# Patient Record
Sex: Female | Born: 1935 | Hispanic: No | Smoking: Never smoker
Health system: Southern US, Community
[De-identification: ages and names within clinical notes are randomized; demographics above are authoritative.]

## PROBLEM LIST (undated history)

## (undated) DIAGNOSIS — E7800 Pure hypercholesterolemia, unspecified: Secondary | ICD-10-CM

## (undated) DIAGNOSIS — K219 Gastro-esophageal reflux disease without esophagitis: Secondary | ICD-10-CM

## (undated) DIAGNOSIS — J45909 Unspecified asthma, uncomplicated: Secondary | ICD-10-CM

## (undated) DIAGNOSIS — Z8619 Personal history of other infectious and parasitic diseases: Secondary | ICD-10-CM

## (undated) HISTORY — DX: Unspecified asthma, uncomplicated: J45.909

## (undated) HISTORY — DX: Personal history of other infectious and parasitic diseases: Z86.19

---

## 2011-03-10 DIAGNOSIS — H269 Unspecified cataract: Secondary | ICD-10-CM | POA: Diagnosis not present

## 2011-03-10 DIAGNOSIS — H2510 Age-related nuclear cataract, unspecified eye: Secondary | ICD-10-CM | POA: Diagnosis not present

## 2011-04-07 DIAGNOSIS — J309 Allergic rhinitis, unspecified: Secondary | ICD-10-CM | POA: Diagnosis not present

## 2011-04-07 DIAGNOSIS — J0100 Acute maxillary sinusitis, unspecified: Secondary | ICD-10-CM | POA: Diagnosis not present

## 2011-06-09 DIAGNOSIS — Z79899 Other long term (current) drug therapy: Secondary | ICD-10-CM | POA: Diagnosis not present

## 2011-06-09 DIAGNOSIS — I10 Essential (primary) hypertension: Secondary | ICD-10-CM | POA: Diagnosis not present

## 2011-06-09 DIAGNOSIS — E559 Vitamin D deficiency, unspecified: Secondary | ICD-10-CM | POA: Diagnosis not present

## 2011-06-09 DIAGNOSIS — E7800 Pure hypercholesterolemia, unspecified: Secondary | ICD-10-CM | POA: Diagnosis not present

## 2011-06-23 DIAGNOSIS — Z1231 Encounter for screening mammogram for malignant neoplasm of breast: Secondary | ICD-10-CM | POA: Diagnosis not present

## 2011-08-04 DIAGNOSIS — J209 Acute bronchitis, unspecified: Secondary | ICD-10-CM | POA: Diagnosis not present

## 2011-09-18 DIAGNOSIS — G500 Trigeminal neuralgia: Secondary | ICD-10-CM | POA: Diagnosis not present

## 2011-10-23 DIAGNOSIS — G500 Trigeminal neuralgia: Secondary | ICD-10-CM | POA: Diagnosis not present

## 2011-12-29 DIAGNOSIS — Z23 Encounter for immunization: Secondary | ICD-10-CM | POA: Diagnosis not present

## 2012-04-21 DIAGNOSIS — L820 Inflamed seborrheic keratosis: Secondary | ICD-10-CM | POA: Diagnosis not present

## 2012-04-21 DIAGNOSIS — D485 Neoplasm of uncertain behavior of skin: Secondary | ICD-10-CM | POA: Diagnosis not present

## 2012-06-15 DIAGNOSIS — M949 Disorder of cartilage, unspecified: Secondary | ICD-10-CM | POA: Diagnosis not present

## 2012-06-15 DIAGNOSIS — K219 Gastro-esophageal reflux disease without esophagitis: Secondary | ICD-10-CM | POA: Diagnosis not present

## 2012-06-15 DIAGNOSIS — I10 Essential (primary) hypertension: Secondary | ICD-10-CM | POA: Diagnosis not present

## 2012-06-15 DIAGNOSIS — M899 Disorder of bone, unspecified: Secondary | ICD-10-CM | POA: Diagnosis not present

## 2012-06-15 DIAGNOSIS — E7800 Pure hypercholesterolemia, unspecified: Secondary | ICD-10-CM | POA: Diagnosis not present

## 2012-06-15 DIAGNOSIS — Z79899 Other long term (current) drug therapy: Secondary | ICD-10-CM | POA: Diagnosis not present

## 2012-06-28 DIAGNOSIS — Z1231 Encounter for screening mammogram for malignant neoplasm of breast: Secondary | ICD-10-CM | POA: Diagnosis not present

## 2012-07-16 DIAGNOSIS — R509 Fever, unspecified: Secondary | ICD-10-CM | POA: Diagnosis not present

## 2012-08-02 DIAGNOSIS — J0100 Acute maxillary sinusitis, unspecified: Secondary | ICD-10-CM | POA: Diagnosis not present

## 2012-12-01 DIAGNOSIS — Z23 Encounter for immunization: Secondary | ICD-10-CM | POA: Diagnosis not present

## 2013-06-20 DIAGNOSIS — K219 Gastro-esophageal reflux disease without esophagitis: Secondary | ICD-10-CM | POA: Diagnosis not present

## 2013-06-20 DIAGNOSIS — M899 Disorder of bone, unspecified: Secondary | ICD-10-CM | POA: Diagnosis not present

## 2013-06-20 DIAGNOSIS — Z0000 Encounter for general adult medical examination without abnormal findings: Secondary | ICD-10-CM | POA: Diagnosis not present

## 2013-06-20 DIAGNOSIS — E7800 Pure hypercholesterolemia, unspecified: Secondary | ICD-10-CM | POA: Diagnosis not present

## 2013-06-20 DIAGNOSIS — Z780 Asymptomatic menopausal state: Secondary | ICD-10-CM | POA: Diagnosis not present

## 2013-06-20 DIAGNOSIS — M949 Disorder of cartilage, unspecified: Secondary | ICD-10-CM | POA: Diagnosis not present

## 2013-06-20 DIAGNOSIS — I10 Essential (primary) hypertension: Secondary | ICD-10-CM | POA: Diagnosis not present

## 2013-06-20 DIAGNOSIS — E559 Vitamin D deficiency, unspecified: Secondary | ICD-10-CM | POA: Diagnosis not present

## 2013-06-20 DIAGNOSIS — Z23 Encounter for immunization: Secondary | ICD-10-CM | POA: Diagnosis not present

## 2013-06-20 DIAGNOSIS — Z79899 Other long term (current) drug therapy: Secondary | ICD-10-CM | POA: Diagnosis not present

## 2013-06-29 DIAGNOSIS — Z1231 Encounter for screening mammogram for malignant neoplasm of breast: Secondary | ICD-10-CM | POA: Diagnosis not present

## 2013-10-03 DIAGNOSIS — H26499 Other secondary cataract, unspecified eye: Secondary | ICD-10-CM | POA: Diagnosis not present

## 2013-11-29 DIAGNOSIS — Z23 Encounter for immunization: Secondary | ICD-10-CM | POA: Diagnosis not present

## 2014-06-22 DIAGNOSIS — Z0000 Encounter for general adult medical examination without abnormal findings: Secondary | ICD-10-CM | POA: Diagnosis not present

## 2014-06-22 DIAGNOSIS — Z79899 Other long term (current) drug therapy: Secondary | ICD-10-CM | POA: Diagnosis not present

## 2014-06-22 DIAGNOSIS — G500 Trigeminal neuralgia: Secondary | ICD-10-CM | POA: Diagnosis not present

## 2014-06-22 DIAGNOSIS — I10 Essential (primary) hypertension: Secondary | ICD-10-CM | POA: Diagnosis not present

## 2014-06-22 DIAGNOSIS — E780 Pure hypercholesterolemia: Secondary | ICD-10-CM | POA: Diagnosis not present

## 2014-06-22 DIAGNOSIS — K219 Gastro-esophageal reflux disease without esophagitis: Secondary | ICD-10-CM | POA: Diagnosis not present

## 2014-07-03 DIAGNOSIS — Z1231 Encounter for screening mammogram for malignant neoplasm of breast: Secondary | ICD-10-CM | POA: Diagnosis not present

## 2014-12-06 DIAGNOSIS — Z23 Encounter for immunization: Secondary | ICD-10-CM | POA: Diagnosis not present

## 2015-02-28 ENCOUNTER — Encounter: Payer: Self-pay | Admitting: Sports Medicine

## 2015-02-28 ENCOUNTER — Ambulatory Visit (INDEPENDENT_AMBULATORY_CARE_PROVIDER_SITE_OTHER): Payer: Medicare Other | Admitting: Sports Medicine

## 2015-02-28 ENCOUNTER — Ambulatory Visit (INDEPENDENT_AMBULATORY_CARE_PROVIDER_SITE_OTHER): Payer: Medicare Other

## 2015-02-28 DIAGNOSIS — M79671 Pain in right foot: Secondary | ICD-10-CM

## 2015-02-28 DIAGNOSIS — M722 Plantar fascial fibromatosis: Secondary | ICD-10-CM | POA: Diagnosis not present

## 2015-02-28 MED ORDER — TRIAMCINOLONE ACETONIDE 10 MG/ML IJ SUSP: 10.0000 mg | Freq: Once | INTRAMUSCULAR | Status: DC

## 2015-02-28 NOTE — Progress Notes (Signed)
Patient ID: Wanda Patrick, female   DOB: [birth year], 80 y.o.   MRN: [id number] Subjective: Wanda Patrick is a 80 y.o. female patient presents to office with complaint of heel pain on the right. Patient admits to post static dyskinesia for 2 months in duration after walking a lot at a [place] show. Patient has treated this problem with icing, gentle stretching, and where her inserts. Patient reports that this treatment has helped her get through the holidays but it is still bothersome. Admits to most recently lateral foot/ankle pain. Denies know injury/sprain/trauma. Patient denies any other pedal complaints at this time.  Review of Systems  All other systems reviewed and are negative.  There are no active problems to display for this patient.  No current outpatient prescriptions on file prior to visit.   No current facility-administered medications on file prior to visit.   Allergies  Allergen Reactions  . Erythromycin   . Macrodantin [Nitrofurantoin]   . Sulfa Antibiotics     Objective: Physical Exam General: The patient is alert and oriented x3 in no acute distress.  Dermatology: Skin is warm, dry and supple bilateral lower extremities. Nails 1-10 are within normal . There is no erythema, edema, no eccymosis, no open lesions present. Integument is otherwise unremarkable.  Vascular: Dorsalis Pedis pulse and Posterior Tibial pulse are 1/4 bilateral. + varicosities bilateral. Capillary fill time is immediate to all digits.  Neurological: Grossly intact to light touch with an achilles reflex of +2/5 and a negative Tinel's sign bilateral.  Musculoskeletal: Tenderness to palpation at the medial calcaneal tubercale and through the insertion of the plantar fascia on the right foot. No pain with palpation to lateral foot or ankle on right. No pain with compression of calcaneus bilateral. No pain with tuning fork to calcaneus bilateral. No pain with calf compression bilateral.  All joints  range of motion within normal limits bilateral. Strength 5/5 in all groups bilateral.    Xray, Righ foot:  Normal osseous mineralization. Joint spaces preserved. No fracture/dislocation/boney destruction. Bunion and Lesser hammertoe with most contracture at 2nd toe, Calcaneal spur present with mild thickening of plantar fascia. No other soft tissue abnormalities or radiopaque foreign bodies.   Assessment and Plan: Problem List Items Addressed This Visit    None    Visit Diagnoses    Right foot pain    -  Primary    Relevant Medications    triamcinolone acetonide (KENALOG) 10 MG/ML injection 10 mg    Other Relevant Orders    DG Foot 2 Views Right    Plantar fasciitis of right foot        Relevant Medications    triamcinolone acetonide (KENALOG) 10 MG/ML injection 10 mg       -Complete examination performed. Discussed with patient in detail the condition of plantar fasciitis, how this occurs and general treatment options. Explained both conservative and surgical treatments. Explained that ocassional lateral foot and ankle discomfort is likely from compensation.   -After oral consent and aseptic prep, injected a mixture containing 1 ml of 2%  plain lidocaine, 1 ml 0.5% plain marcaine, 0.5 ml of kenalog 10 and 0.5 ml of dexamethasone phosphate into right heel. Post-injection care discussed with patient.  -No anti-inflammatories or oral meds given at this time due to previous history of gastritis  -Recommended good supportive shoes and advised use of custom insert of which she owns; added felt kinetic wedge to heel post to add additional support and stability  -Explained  in detail the use of the fascial brace which was dispensed at today's visit. -Explained and dispensed to patient daily stretching exercises. -Recommend patient to ice affected area 1-2x daily. -Patient to return to office in 3 weeks for follow up or sooner if problems or questions arise.  Landis Martins, DPM

## 2015-02-28 NOTE — Patient Instructions (Signed)

## 2015-02-28 NOTE — Progress Notes (Deleted)
   Subjective:    Patient ID: Wanda Patrick, female    DOB: 07/01/1935, 80 y.o.   MRN: [id number]  HPI    Review of Systems  All other systems reviewed and are negative.      Objective:   Physical Exam        Assessment & Plan:  d

## 2015-03-21 ENCOUNTER — Encounter: Payer: Self-pay | Admitting: Sports Medicine

## 2015-03-21 ENCOUNTER — Ambulatory Visit (INDEPENDENT_AMBULATORY_CARE_PROVIDER_SITE_OTHER): Payer: Medicare Other | Admitting: Sports Medicine

## 2015-03-21 DIAGNOSIS — M6588 Other synovitis and tenosynovitis, other site: Secondary | ICD-10-CM | POA: Diagnosis not present

## 2015-03-21 DIAGNOSIS — M79671 Pain in right foot: Secondary | ICD-10-CM

## 2015-03-21 DIAGNOSIS — M722 Plantar fascial fibromatosis: Secondary | ICD-10-CM

## 2015-03-21 DIAGNOSIS — M7750 Other enthesopathy of unspecified foot: Secondary | ICD-10-CM

## 2015-03-21 MED ORDER — TRIAMCINOLONE ACETONIDE 10 MG/ML IJ SUSP: 10.0000 mg | Freq: Once | INTRAMUSCULAR | Status: DC

## 2015-03-21 NOTE — Progress Notes (Signed)
Patient ID: MADSION WORDLAW, female   DOB: 10-Oct-1935, 80 y.o.   MRN: [id number]  Subjective: Wanda Patrick is a 80 y.o. female patient presents to office for follow up eval of heel pain on the right, s/p injection #1 states that the injection has helped the bottom of the heel but now is having more pain to ankle that is made worse by brace. Patient denies any other pedal complaints at this time.   There are no active problems to display for this patient.  Current Outpatient Prescriptions on File Prior to Visit  Medication Sig Dispense Refill  . gabapentin (NEURONTIN) 300 MG capsule Take 300 mg by mouth daily.    Marland Kitchen losartan (COZAAR) 50 MG tablet Take 50 mg by mouth daily.    . meclizine (ANTIVERT) 25 MG tablet Take 25 mg by mouth 3 (three) times daily as needed for dizziness.    Marland Kitchen omeprazole (PRILOSEC) 20 MG capsule Take 20 mg by mouth daily.    . simvastatin (ZOCOR) 20 MG tablet Take 20 mg by mouth daily.     Current Facility-Administered Medications on File Prior to Visit  Medication Dose Route Frequency Provider Last Rate Last Dose  . triamcinolone acetonide (KENALOG) 10 MG/ML injection 10 mg  10 mg Other Once Landis Martins, DPM       Allergies  Allergen Reactions  . Erythromycin   . Macrodantin [Nitrofurantoin]   . Sulfa Antibiotics     Objective: Physical Exam General: The patient is alert and oriented x3 in no acute distress.  Dermatology: Skin is warm, dry and supple bilateral lower extremities. Nails 1-10 are within normal . There is no erythema, edema, no eccymosis, no open lesions present. Integument is otherwise unremarkable.  Vascular: Dorsalis Pedis pulse and Posterior Tibial pulse are 1/4 bilateral. + varicosities bilateral. Capillary fill time is immediate to all digits.  Neurological: Grossly intact to light touch with an achilles reflex of +2/5 and a negative Tinel's sign bilateral.  Musculoskeletal: Tenderness to palpation at the medial calcaneal tubercale and  through the insertion of the plantar fascia on the right foot. There is pain with palpation to lateral foot at peroneal tendons on right, no instability. No pain with compression of calcaneus bilateral. No pain with tuning fork to calcaneus bilateral. No pain with calf compression bilateral.  All joints range of motion within normal limits bilateral. Strength 5/5 in all groups bilateral.   Assessment and Plan: Problem List Items Addressed This Visit    None    Visit Diagnoses    Right foot pain    -  Primary    Relevant Medications    triamcinolone acetonide (KENALOG) 10 MG/ML injection 10 mg    Plantar fasciitis of right foot        Tendonitis of foot        Right, peroneals    Relevant Medications    triamcinolone acetonide (KENALOG) 10 MG/ML injection 10 mg       -Complete examination performed. Discussed with patient in detail the condition of plantar fasciitis and compensation tendonitis  -After oral consent and aseptic prep, injected a mixture containing 1 ml of 2%  plain lidocaine, 1 ml 0.5% plain marcaine, 0.5 ml of kenalog 10 and 0.5 ml of dexamethasone phosphate into right peroneal tendon sheath at point of max tenderness. Post-injection care discussed with patient.  -No anti-inflammatories or oral meds given at this time due to previous history of gastritis  -Recommended good supportive shoes and advised use  of custom insert of which she owns; reapplied felt kinetic wedge to heel post to add additional support and stability; may consider new set of orthotics once symptoms are resolved -Patient may wean from use of fascial brace -Cont daily stretching exercises. -Cont to ice affected area 1-2x daily. -Patient to return to office in 3 weeks for follow up or sooner if problems or questions arise.  Landis Martins, DPM

## 2015-04-11 ENCOUNTER — Encounter: Payer: Self-pay | Admitting: Sports Medicine

## 2015-04-11 ENCOUNTER — Ambulatory Visit (INDEPENDENT_AMBULATORY_CARE_PROVIDER_SITE_OTHER): Payer: Medicare Other | Admitting: Sports Medicine

## 2015-04-11 DIAGNOSIS — M6588 Other synovitis and tenosynovitis, other site: Secondary | ICD-10-CM

## 2015-04-11 DIAGNOSIS — M7750 Other enthesopathy of unspecified foot: Secondary | ICD-10-CM

## 2015-04-11 DIAGNOSIS — M722 Plantar fascial fibromatosis: Secondary | ICD-10-CM | POA: Diagnosis not present

## 2015-04-11 DIAGNOSIS — M79671 Pain in right foot: Secondary | ICD-10-CM

## 2015-04-11 NOTE — Progress Notes (Signed)
Patient ID: Wanda Patrick, female   DOB: 1935-11-29, 80 y.o.   MRN: [id number]  Subjective: Wanda Patrick is a 80 y.o. female patient presents to office for follow up eval of heel pain and peroneal tendon pain on the right, s/p injection #1 at the peroneal tendon site; states that the injection caused redness in her face the next day and her blood pressure went up; states that she also could not sleep and does not want another injection. Patient denies any other pedal complaints at this time.   There are no active problems to display for this patient.  Current Outpatient Prescriptions on File Prior to Visit  Medication Sig Dispense Refill  . gabapentin (NEURONTIN) 300 MG capsule Take 300 mg by mouth daily.    Marland Kitchen losartan (COZAAR) 50 MG tablet Take 50 mg by mouth daily.    . meclizine (ANTIVERT) 25 MG tablet Take 25 mg by mouth 3 (three) times daily as needed for dizziness.    Marland Kitchen omeprazole (PRILOSEC) 20 MG capsule Take 20 mg by mouth daily.    . simvastatin (ZOCOR) 20 MG tablet Take 20 mg by mouth daily.     Current Facility-Administered Medications on File Prior to Visit  Medication Dose Route Frequency Provider Last Rate Last Dose  . triamcinolone acetonide (KENALOG) 10 MG/ML injection 10 mg  10 mg Other Once [employer], DPM      . triamcinolone acetonide (KENALOG) 10 MG/ML injection 10 mg  10 mg Other Once Landis Martins, DPM       Allergies  Allergen Reactions  . Erythromycin   . Macrodantin [Nitrofurantoin]   . Sulfa Antibiotics     Objective: Physical Exam General: The patient is alert and oriented x3 in no acute distress.  Dermatology: Skin is warm, dry and supple bilateral lower extremities. Nails 1-10 are within normal . There is no erythema, edema, no eccymosis, no open lesions present. Integument is otherwise unremarkable.  Vascular: Dorsalis Pedis pulse and Posterior Tibial pulse are 1/4 bilateral. + varicosities bilateral. Capillary fill time is immediate to all  digits.  Neurological: Grossly intact to light touch with an achilles reflex of +2/5 and a negative Tinel's sign bilateral.  Musculoskeletal: Decreased Tenderness to palpation at the medial calcaneal tubercale and through the insertion of the plantar fascia on the right foot. There is decreased pain with palpation to lateral foot at peroneal tendons on right, no instability. No pain with compression of calcaneus bilateral. No pain with tuning fork to calcaneus bilateral. No pain with calf compression bilateral.  All joints range of motion within normal limits bilateral. Strength 5/5 in all groups bilateral.   Assessment and Plan: Problem List Items Addressed This Visit    None    Visit Diagnoses    Right foot pain    -  Primary    Plantar fasciitis of right foot        Tendonitis of foot           -Complete examination performed. Discussed with patient in detail the condition of plantar fasciitis and compensation tendonitis  -Rx PT at [facility] with treatment modalities  -No injection administer due to reaction after injection on last visit; informed patient that blood pressure increase is atypical of local steroid shot but could be likely secondary to anxiety of the injection -No anti-inflammatories or oral meds given at this time due to previous history of gastritis  -Recommended good supportive shoes and advised use of custom insert of which she  owns; cont with felt kinetic wedge to heel post to add additional support and stability; may consider new set of orthotics once symptoms are resolved -Patient to discontinue use of fascial brace -Cont daily stretching exercises. -Cont to ice affected area 1-2x daily. May soak with Epsom salt but must ice following. -Patient to return to office in 4 weeks for follow up or sooner if problems or questions arise.  Landis Martins, DPM

## 2015-04-17 DIAGNOSIS — R262 Difficulty in walking, not elsewhere classified: Secondary | ICD-10-CM | POA: Diagnosis not present

## 2015-04-17 DIAGNOSIS — M722 Plantar fascial fibromatosis: Secondary | ICD-10-CM | POA: Diagnosis not present

## 2015-04-17 DIAGNOSIS — M799 Soft tissue disorder, unspecified: Secondary | ICD-10-CM | POA: Diagnosis not present

## 2015-04-17 DIAGNOSIS — M6281 Muscle weakness (generalized): Secondary | ICD-10-CM | POA: Diagnosis not present

## 2015-04-24 DIAGNOSIS — M722 Plantar fascial fibromatosis: Secondary | ICD-10-CM | POA: Diagnosis not present

## 2015-04-24 DIAGNOSIS — M799 Soft tissue disorder, unspecified: Secondary | ICD-10-CM | POA: Diagnosis not present

## 2015-04-24 DIAGNOSIS — M6281 Muscle weakness (generalized): Secondary | ICD-10-CM | POA: Diagnosis not present

## 2015-04-24 DIAGNOSIS — R262 Difficulty in walking, not elsewhere classified: Secondary | ICD-10-CM | POA: Diagnosis not present

## 2015-05-01 DIAGNOSIS — M799 Soft tissue disorder, unspecified: Secondary | ICD-10-CM | POA: Diagnosis not present

## 2015-05-01 DIAGNOSIS — M6281 Muscle weakness (generalized): Secondary | ICD-10-CM | POA: Diagnosis not present

## 2015-05-01 DIAGNOSIS — M722 Plantar fascial fibromatosis: Secondary | ICD-10-CM | POA: Diagnosis not present

## 2015-05-01 DIAGNOSIS — R262 Difficulty in walking, not elsewhere classified: Secondary | ICD-10-CM | POA: Diagnosis not present

## 2015-05-08 DIAGNOSIS — R262 Difficulty in walking, not elsewhere classified: Secondary | ICD-10-CM | POA: Diagnosis not present

## 2015-05-08 DIAGNOSIS — M799 Soft tissue disorder, unspecified: Secondary | ICD-10-CM | POA: Diagnosis not present

## 2015-05-08 DIAGNOSIS — M722 Plantar fascial fibromatosis: Secondary | ICD-10-CM | POA: Diagnosis not present

## 2015-05-08 DIAGNOSIS — M6281 Muscle weakness (generalized): Secondary | ICD-10-CM | POA: Diagnosis not present

## 2015-05-09 ENCOUNTER — Encounter: Payer: Self-pay | Admitting: Sports Medicine

## 2015-05-09 ENCOUNTER — Ambulatory Visit (INDEPENDENT_AMBULATORY_CARE_PROVIDER_SITE_OTHER): Payer: Medicare Other | Admitting: Sports Medicine

## 2015-05-09 DIAGNOSIS — M79671 Pain in right foot: Secondary | ICD-10-CM

## 2015-05-09 DIAGNOSIS — M6588 Other synovitis and tenosynovitis, other site: Secondary | ICD-10-CM

## 2015-05-09 DIAGNOSIS — M7750 Other enthesopathy of unspecified foot: Secondary | ICD-10-CM

## 2015-05-09 DIAGNOSIS — M722 Plantar fascial fibromatosis: Secondary | ICD-10-CM

## 2015-05-09 NOTE — Progress Notes (Signed)
Patient ID: Wanda Patrick, female   DOB: 10/01/1935, 80 y.o.   MRN: [id number]  Subjective: Wanda Patrick is a 80 y.o. female patient presents to office for follow up eval of heel pain and peroneal tendon pain on the right, patient states that with therapy. She feels a lot better, feels like the pain is almost completely gone. States that she has learned a lot about her feet since going to therapy and has continued to do home exercises as instructed and soaked in ice as needed with continual improvement. Patient desires new custom inserts since old ones are worn and are likely the culprit of her tendinitis on the right foot. Patient denies any other pedal complaints at this time.  There are no active problems to display for this patient.  Current Outpatient Prescriptions on File Prior to Visit  Medication Sig Dispense Refill  . gabapentin (NEURONTIN) 300 MG capsule Take 300 mg by mouth daily.    Marland Kitchen losartan (COZAAR) 50 MG tablet Take 50 mg by mouth daily.    . meclizine (ANTIVERT) 25 MG tablet Take 25 mg by mouth 3 (three) times daily as needed for dizziness.    Marland Kitchen omeprazole (PRILOSEC) 20 MG capsule Take 20 mg by mouth daily.    . simvastatin (ZOCOR) 20 MG tablet Take 20 mg by mouth daily.     Current Facility-Administered Medications on File Prior to Visit  Medication Dose Route Frequency Provider Last Rate Last Dose  . triamcinolone acetonide (KENALOG) 10 MG/ML injection 10 mg  10 mg Other Once [employer], DPM      . triamcinolone acetonide (KENALOG) 10 MG/ML injection 10 mg  10 mg Other Once Landis Martins, DPM       Allergies  Allergen Reactions  . Erythromycin   . Macrodantin [Nitrofurantoin]   . Sulfa Antibiotics     Objective: Physical Exam General: The patient is alert and oriented x3 in no acute distress.  Dermatology: Skin is warm, dry and supple bilateral lower extremities. Nails 1-10 are within normal . There is no erythema, edema, no eccymosis, no open lesions present.  Integument is otherwise unremarkable.  Vascular: Dorsalis Pedis pulse and Posterior Tibial pulse are 1/4 bilateral. + varicosities bilateral. Capillary fill time is immediate to all digits.  Neurological: Grossly intact to light touch with an achilles reflex of +2/5 and a negative Tinel's sign bilateral.  Musculoskeletal: No Tenderness to palpation at the medial calcaneal tubercale and through the insertion of the plantar fascia on the right foot. There is decreased pain with palpation to lateral foot at peroneal tendons on right, no instability. No pain with compression of calcaneus bilateral. No pain with tuning fork to calcaneus bilateral. No pain with calf compression bilateral.  All joints range of motion within normal limits bilateral. Strength 5/5 in all groups bilateral.   Assessment and Plan: Problem List Items Addressed This Visit    None    Visit Diagnoses    Right foot pain    -  Primary    Plantar fasciitis of right foot        Tendonitis of foot           -Complete examination performed. Discussed with patient in detail the condition of plantar fasciitis and compensation tendonitis and the need for orthotics for long-term management -Patient agreeable to new orthotics and is aware of [facility] , since they are not covered by her insurance -Patient was scanned for custom orthotics and prescription sent to [facility]  lab full length orthotic with deep heel cup accommodation posted to neutral -Patient has completed physical therapy at this time -Recommended good supportive shoes and advised use of custom insert of which she owns underneath the lining in her shoe until her new orthotics arrive; patient states that she prefers to continue with the current. Once to the new ones are received, because she feels like she cannot go without orthotics, has tried it before in the past and has had more pain and symptoms -Cont daily stretching exercises. -Cont to ice affected area 1-2x daily.  May soak with Epsom salt but must ice following as needed. -Patient to return to office to pick up orthotics or sooner if problems or questions arise.  Landis Martins, DPM

## 2015-05-30 ENCOUNTER — Ambulatory Visit: Payer: Medicare Other | Admitting: Sports Medicine

## 2015-06-13 ENCOUNTER — Telehealth: Payer: Self-pay | Admitting: *Deleted

## 2015-06-13 NOTE — Telephone Encounter (Signed)
Pt states she is calling concerning her orthotics that were taken in March for a problem, and were to be back 2 weeks ago.

## 2015-06-13 NOTE — Telephone Encounter (Signed)
Thank you , I will call her today the orthotics just came in this morning.  Anderson Malta

## 2015-06-21 ENCOUNTER — Encounter: Payer: Medicare Other | Admitting: Sports Medicine

## 2015-06-26 DIAGNOSIS — Z79899 Other long term (current) drug therapy: Secondary | ICD-10-CM | POA: Diagnosis not present

## 2015-06-26 DIAGNOSIS — Z0000 Encounter for general adult medical examination without abnormal findings: Secondary | ICD-10-CM | POA: Diagnosis not present

## 2015-06-26 DIAGNOSIS — G500 Trigeminal neuralgia: Secondary | ICD-10-CM | POA: Diagnosis not present

## 2015-06-26 DIAGNOSIS — E785 Hyperlipidemia, unspecified: Secondary | ICD-10-CM | POA: Diagnosis not present

## 2015-06-26 DIAGNOSIS — I10 Essential (primary) hypertension: Secondary | ICD-10-CM | POA: Diagnosis not present

## 2015-06-26 DIAGNOSIS — K219 Gastro-esophageal reflux disease without esophagitis: Secondary | ICD-10-CM | POA: Diagnosis not present

## 2015-06-27 ENCOUNTER — Ambulatory Visit (INDEPENDENT_AMBULATORY_CARE_PROVIDER_SITE_OTHER): Payer: Medicare Other | Admitting: Sports Medicine

## 2015-06-27 ENCOUNTER — Encounter: Payer: Self-pay | Admitting: Sports Medicine

## 2015-06-27 DIAGNOSIS — M722 Plantar fascial fibromatosis: Secondary | ICD-10-CM

## 2015-06-27 DIAGNOSIS — M6588 Other synovitis and tenosynovitis, other site: Secondary | ICD-10-CM

## 2015-06-27 DIAGNOSIS — M79671 Pain in right foot: Secondary | ICD-10-CM

## 2015-06-27 DIAGNOSIS — M7750 Other enthesopathy of unspecified foot: Secondary | ICD-10-CM

## 2015-06-27 NOTE — Progress Notes (Signed)
Patient ID: Wanda Patrick, female   DOB: Aug 15, 1935, 80 y.o.   MRN: [id number] Dispensing of orthotics performed by medical assistant. Patient discussed with medical assistant. Patient was fitted and dispensed her custom foot orthotics with break-in period explained. Patient denied at this encounter any discomfort. Patient had no questions for physicians so thus was dismissed. Patient to follow up as scheduled for continued care or sooner if problems or issues arise. -Dr. Cannon Kettle

## 2015-06-27 NOTE — Patient Instructions (Signed)

## 2015-07-04 DIAGNOSIS — Z1231 Encounter for screening mammogram for malignant neoplasm of breast: Secondary | ICD-10-CM | POA: Diagnosis not present

## 2015-12-06 DIAGNOSIS — Z23 Encounter for immunization: Secondary | ICD-10-CM | POA: Diagnosis not present

## 2015-12-20 DIAGNOSIS — N3090 Cystitis, unspecified without hematuria: Secondary | ICD-10-CM | POA: Diagnosis not present

## 2015-12-20 DIAGNOSIS — N3001 Acute cystitis with hematuria: Secondary | ICD-10-CM | POA: Diagnosis not present

## 2015-12-24 DIAGNOSIS — R42 Dizziness and giddiness: Secondary | ICD-10-CM | POA: Diagnosis not present

## 2015-12-28 DIAGNOSIS — R42 Dizziness and giddiness: Secondary | ICD-10-CM | POA: Diagnosis not present

## 2016-01-02 DIAGNOSIS — R42 Dizziness and giddiness: Secondary | ICD-10-CM | POA: Diagnosis not present

## 2016-01-09 DIAGNOSIS — R42 Dizziness and giddiness: Secondary | ICD-10-CM | POA: Diagnosis not present

## 2016-01-16 DIAGNOSIS — R42 Dizziness and giddiness: Secondary | ICD-10-CM | POA: Diagnosis not present

## 2016-01-30 DIAGNOSIS — R42 Dizziness and giddiness: Secondary | ICD-10-CM | POA: Diagnosis not present

## 2016-03-25 DIAGNOSIS — H26493 Other secondary cataract, bilateral: Secondary | ICD-10-CM | POA: Diagnosis not present

## 2016-04-08 DIAGNOSIS — H18413 Arcus senilis, bilateral: Secondary | ICD-10-CM | POA: Diagnosis not present

## 2016-04-08 DIAGNOSIS — H02839 Dermatochalasis of unspecified eye, unspecified eyelid: Secondary | ICD-10-CM | POA: Diagnosis not present

## 2016-04-08 DIAGNOSIS — H26492 Other secondary cataract, left eye: Secondary | ICD-10-CM | POA: Diagnosis not present

## 2016-04-08 DIAGNOSIS — Z961 Presence of intraocular lens: Secondary | ICD-10-CM | POA: Diagnosis not present

## 2016-04-08 DIAGNOSIS — H26493 Other secondary cataract, bilateral: Secondary | ICD-10-CM | POA: Diagnosis not present

## 2016-04-29 DIAGNOSIS — H26491 Other secondary cataract, right eye: Secondary | ICD-10-CM | POA: Diagnosis not present

## 2016-04-29 DIAGNOSIS — H26493 Other secondary cataract, bilateral: Secondary | ICD-10-CM | POA: Diagnosis not present

## 2016-06-08 DIAGNOSIS — J189 Pneumonia, unspecified organism: Secondary | ICD-10-CM | POA: Diagnosis not present

## 2016-06-19 DIAGNOSIS — H1852 Epithelial (juvenile) corneal dystrophy: Secondary | ICD-10-CM | POA: Diagnosis not present

## 2016-06-30 DIAGNOSIS — G500 Trigeminal neuralgia: Secondary | ICD-10-CM | POA: Diagnosis not present

## 2016-06-30 DIAGNOSIS — I10 Essential (primary) hypertension: Secondary | ICD-10-CM | POA: Diagnosis not present

## 2016-06-30 DIAGNOSIS — Z0000 Encounter for general adult medical examination without abnormal findings: Secondary | ICD-10-CM | POA: Diagnosis not present

## 2016-06-30 DIAGNOSIS — Z79899 Other long term (current) drug therapy: Secondary | ICD-10-CM | POA: Diagnosis not present

## 2016-06-30 DIAGNOSIS — E785 Hyperlipidemia, unspecified: Secondary | ICD-10-CM | POA: Diagnosis not present

## 2016-06-30 DIAGNOSIS — H8110 Benign paroxysmal vertigo, unspecified ear: Secondary | ICD-10-CM | POA: Diagnosis not present

## 2016-06-30 DIAGNOSIS — K219 Gastro-esophageal reflux disease without esophagitis: Secondary | ICD-10-CM | POA: Diagnosis not present

## 2016-07-08 DIAGNOSIS — Z1231 Encounter for screening mammogram for malignant neoplasm of breast: Secondary | ICD-10-CM | POA: Diagnosis not present

## 2016-07-29 DIAGNOSIS — M62830 Muscle spasm of back: Secondary | ICD-10-CM | POA: Diagnosis not present

## 2016-07-29 DIAGNOSIS — M9902 Segmental and somatic dysfunction of thoracic region: Secondary | ICD-10-CM | POA: Diagnosis not present

## 2016-07-29 DIAGNOSIS — M9903 Segmental and somatic dysfunction of lumbar region: Secondary | ICD-10-CM | POA: Diagnosis not present

## 2016-07-29 DIAGNOSIS — M50322 Other cervical disc degeneration at C5-C6 level: Secondary | ICD-10-CM | POA: Diagnosis not present

## 2016-07-29 DIAGNOSIS — M5412 Radiculopathy, cervical region: Secondary | ICD-10-CM | POA: Diagnosis not present

## 2016-07-29 DIAGNOSIS — M9901 Segmental and somatic dysfunction of cervical region: Secondary | ICD-10-CM | POA: Diagnosis not present

## 2016-07-29 DIAGNOSIS — M542 Cervicalgia: Secondary | ICD-10-CM | POA: Diagnosis not present

## 2016-08-04 DIAGNOSIS — M50322 Other cervical disc degeneration at C5-C6 level: Secondary | ICD-10-CM | POA: Diagnosis not present

## 2016-08-04 DIAGNOSIS — M542 Cervicalgia: Secondary | ICD-10-CM | POA: Diagnosis not present

## 2016-08-04 DIAGNOSIS — M9901 Segmental and somatic dysfunction of cervical region: Secondary | ICD-10-CM | POA: Diagnosis not present

## 2016-08-04 DIAGNOSIS — M5412 Radiculopathy, cervical region: Secondary | ICD-10-CM | POA: Diagnosis not present

## 2016-08-04 DIAGNOSIS — M62830 Muscle spasm of back: Secondary | ICD-10-CM | POA: Diagnosis not present

## 2016-08-04 DIAGNOSIS — M9903 Segmental and somatic dysfunction of lumbar region: Secondary | ICD-10-CM | POA: Diagnosis not present

## 2016-08-04 DIAGNOSIS — M9902 Segmental and somatic dysfunction of thoracic region: Secondary | ICD-10-CM | POA: Diagnosis not present

## 2016-08-05 DIAGNOSIS — M9901 Segmental and somatic dysfunction of cervical region: Secondary | ICD-10-CM | POA: Diagnosis not present

## 2016-08-05 DIAGNOSIS — M542 Cervicalgia: Secondary | ICD-10-CM | POA: Diagnosis not present

## 2016-08-05 DIAGNOSIS — M9903 Segmental and somatic dysfunction of lumbar region: Secondary | ICD-10-CM | POA: Diagnosis not present

## 2016-08-05 DIAGNOSIS — M50322 Other cervical disc degeneration at C5-C6 level: Secondary | ICD-10-CM | POA: Diagnosis not present

## 2016-08-05 DIAGNOSIS — M5412 Radiculopathy, cervical region: Secondary | ICD-10-CM | POA: Diagnosis not present

## 2016-08-05 DIAGNOSIS — M62830 Muscle spasm of back: Secondary | ICD-10-CM | POA: Diagnosis not present

## 2016-08-05 DIAGNOSIS — M9902 Segmental and somatic dysfunction of thoracic region: Secondary | ICD-10-CM | POA: Diagnosis not present

## 2016-08-06 DIAGNOSIS — M62830 Muscle spasm of back: Secondary | ICD-10-CM | POA: Diagnosis not present

## 2016-08-06 DIAGNOSIS — M9901 Segmental and somatic dysfunction of cervical region: Secondary | ICD-10-CM | POA: Diagnosis not present

## 2016-08-06 DIAGNOSIS — M542 Cervicalgia: Secondary | ICD-10-CM | POA: Diagnosis not present

## 2016-08-06 DIAGNOSIS — M9903 Segmental and somatic dysfunction of lumbar region: Secondary | ICD-10-CM | POA: Diagnosis not present

## 2016-08-06 DIAGNOSIS — M50322 Other cervical disc degeneration at C5-C6 level: Secondary | ICD-10-CM | POA: Diagnosis not present

## 2016-08-06 DIAGNOSIS — M5412 Radiculopathy, cervical region: Secondary | ICD-10-CM | POA: Diagnosis not present

## 2016-08-06 DIAGNOSIS — M9902 Segmental and somatic dysfunction of thoracic region: Secondary | ICD-10-CM | POA: Diagnosis not present

## 2016-08-11 DIAGNOSIS — M50322 Other cervical disc degeneration at C5-C6 level: Secondary | ICD-10-CM | POA: Diagnosis not present

## 2016-08-11 DIAGNOSIS — M542 Cervicalgia: Secondary | ICD-10-CM | POA: Diagnosis not present

## 2016-08-11 DIAGNOSIS — M9903 Segmental and somatic dysfunction of lumbar region: Secondary | ICD-10-CM | POA: Diagnosis not present

## 2016-08-11 DIAGNOSIS — M5412 Radiculopathy, cervical region: Secondary | ICD-10-CM | POA: Diagnosis not present

## 2016-08-11 DIAGNOSIS — M9901 Segmental and somatic dysfunction of cervical region: Secondary | ICD-10-CM | POA: Diagnosis not present

## 2016-08-11 DIAGNOSIS — M62830 Muscle spasm of back: Secondary | ICD-10-CM | POA: Diagnosis not present

## 2016-08-11 DIAGNOSIS — M9902 Segmental and somatic dysfunction of thoracic region: Secondary | ICD-10-CM | POA: Diagnosis not present

## 2016-08-12 DIAGNOSIS — M62830 Muscle spasm of back: Secondary | ICD-10-CM | POA: Diagnosis not present

## 2016-08-12 DIAGNOSIS — M5412 Radiculopathy, cervical region: Secondary | ICD-10-CM | POA: Diagnosis not present

## 2016-08-12 DIAGNOSIS — M542 Cervicalgia: Secondary | ICD-10-CM | POA: Diagnosis not present

## 2016-08-12 DIAGNOSIS — M50322 Other cervical disc degeneration at C5-C6 level: Secondary | ICD-10-CM | POA: Diagnosis not present

## 2016-08-12 DIAGNOSIS — M9903 Segmental and somatic dysfunction of lumbar region: Secondary | ICD-10-CM | POA: Diagnosis not present

## 2016-08-12 DIAGNOSIS — M9902 Segmental and somatic dysfunction of thoracic region: Secondary | ICD-10-CM | POA: Diagnosis not present

## 2016-08-12 DIAGNOSIS — M9901 Segmental and somatic dysfunction of cervical region: Secondary | ICD-10-CM | POA: Diagnosis not present

## 2016-08-13 DIAGNOSIS — M542 Cervicalgia: Secondary | ICD-10-CM | POA: Diagnosis not present

## 2016-08-13 DIAGNOSIS — M9902 Segmental and somatic dysfunction of thoracic region: Secondary | ICD-10-CM | POA: Diagnosis not present

## 2016-08-13 DIAGNOSIS — M5412 Radiculopathy, cervical region: Secondary | ICD-10-CM | POA: Diagnosis not present

## 2016-08-13 DIAGNOSIS — M62830 Muscle spasm of back: Secondary | ICD-10-CM | POA: Diagnosis not present

## 2016-08-13 DIAGNOSIS — M9903 Segmental and somatic dysfunction of lumbar region: Secondary | ICD-10-CM | POA: Diagnosis not present

## 2016-08-13 DIAGNOSIS — M50322 Other cervical disc degeneration at C5-C6 level: Secondary | ICD-10-CM | POA: Diagnosis not present

## 2016-08-13 DIAGNOSIS — M9901 Segmental and somatic dysfunction of cervical region: Secondary | ICD-10-CM | POA: Diagnosis not present

## 2016-08-18 DIAGNOSIS — M542 Cervicalgia: Secondary | ICD-10-CM | POA: Diagnosis not present

## 2016-08-18 DIAGNOSIS — M9902 Segmental and somatic dysfunction of thoracic region: Secondary | ICD-10-CM | POA: Diagnosis not present

## 2016-08-18 DIAGNOSIS — M5412 Radiculopathy, cervical region: Secondary | ICD-10-CM | POA: Diagnosis not present

## 2016-08-18 DIAGNOSIS — M50322 Other cervical disc degeneration at C5-C6 level: Secondary | ICD-10-CM | POA: Diagnosis not present

## 2016-08-18 DIAGNOSIS — M9903 Segmental and somatic dysfunction of lumbar region: Secondary | ICD-10-CM | POA: Diagnosis not present

## 2016-08-18 DIAGNOSIS — M9901 Segmental and somatic dysfunction of cervical region: Secondary | ICD-10-CM | POA: Diagnosis not present

## 2016-08-18 DIAGNOSIS — M62830 Muscle spasm of back: Secondary | ICD-10-CM | POA: Diagnosis not present

## 2016-08-19 DIAGNOSIS — M9903 Segmental and somatic dysfunction of lumbar region: Secondary | ICD-10-CM | POA: Diagnosis not present

## 2016-08-19 DIAGNOSIS — M9902 Segmental and somatic dysfunction of thoracic region: Secondary | ICD-10-CM | POA: Diagnosis not present

## 2016-08-19 DIAGNOSIS — M9901 Segmental and somatic dysfunction of cervical region: Secondary | ICD-10-CM | POA: Diagnosis not present

## 2016-08-19 DIAGNOSIS — M50322 Other cervical disc degeneration at C5-C6 level: Secondary | ICD-10-CM | POA: Diagnosis not present

## 2016-08-19 DIAGNOSIS — M62830 Muscle spasm of back: Secondary | ICD-10-CM | POA: Diagnosis not present

## 2016-08-19 DIAGNOSIS — M5412 Radiculopathy, cervical region: Secondary | ICD-10-CM | POA: Diagnosis not present

## 2016-08-19 DIAGNOSIS — M542 Cervicalgia: Secondary | ICD-10-CM | POA: Diagnosis not present

## 2016-08-20 DIAGNOSIS — M62830 Muscle spasm of back: Secondary | ICD-10-CM | POA: Diagnosis not present

## 2016-08-20 DIAGNOSIS — M9901 Segmental and somatic dysfunction of cervical region: Secondary | ICD-10-CM | POA: Diagnosis not present

## 2016-08-20 DIAGNOSIS — M5412 Radiculopathy, cervical region: Secondary | ICD-10-CM | POA: Diagnosis not present

## 2016-08-20 DIAGNOSIS — M9902 Segmental and somatic dysfunction of thoracic region: Secondary | ICD-10-CM | POA: Diagnosis not present

## 2016-08-20 DIAGNOSIS — M9903 Segmental and somatic dysfunction of lumbar region: Secondary | ICD-10-CM | POA: Diagnosis not present

## 2016-08-20 DIAGNOSIS — M542 Cervicalgia: Secondary | ICD-10-CM | POA: Diagnosis not present

## 2016-08-20 DIAGNOSIS — M50322 Other cervical disc degeneration at C5-C6 level: Secondary | ICD-10-CM | POA: Diagnosis not present

## 2016-08-25 DIAGNOSIS — M9903 Segmental and somatic dysfunction of lumbar region: Secondary | ICD-10-CM | POA: Diagnosis not present

## 2016-08-25 DIAGNOSIS — M9902 Segmental and somatic dysfunction of thoracic region: Secondary | ICD-10-CM | POA: Diagnosis not present

## 2016-08-25 DIAGNOSIS — M542 Cervicalgia: Secondary | ICD-10-CM | POA: Diagnosis not present

## 2016-08-25 DIAGNOSIS — M9901 Segmental and somatic dysfunction of cervical region: Secondary | ICD-10-CM | POA: Diagnosis not present

## 2016-08-25 DIAGNOSIS — M50322 Other cervical disc degeneration at C5-C6 level: Secondary | ICD-10-CM | POA: Diagnosis not present

## 2016-08-25 DIAGNOSIS — M5412 Radiculopathy, cervical region: Secondary | ICD-10-CM | POA: Diagnosis not present

## 2016-08-25 DIAGNOSIS — M62830 Muscle spasm of back: Secondary | ICD-10-CM | POA: Diagnosis not present

## 2016-08-26 DIAGNOSIS — M62830 Muscle spasm of back: Secondary | ICD-10-CM | POA: Diagnosis not present

## 2016-08-26 DIAGNOSIS — M542 Cervicalgia: Secondary | ICD-10-CM | POA: Diagnosis not present

## 2016-08-26 DIAGNOSIS — M5412 Radiculopathy, cervical region: Secondary | ICD-10-CM | POA: Diagnosis not present

## 2016-08-26 DIAGNOSIS — M9903 Segmental and somatic dysfunction of lumbar region: Secondary | ICD-10-CM | POA: Diagnosis not present

## 2016-08-26 DIAGNOSIS — M9902 Segmental and somatic dysfunction of thoracic region: Secondary | ICD-10-CM | POA: Diagnosis not present

## 2016-08-26 DIAGNOSIS — M50322 Other cervical disc degeneration at C5-C6 level: Secondary | ICD-10-CM | POA: Diagnosis not present

## 2016-08-26 DIAGNOSIS — M9901 Segmental and somatic dysfunction of cervical region: Secondary | ICD-10-CM | POA: Diagnosis not present

## 2016-09-01 DIAGNOSIS — M9903 Segmental and somatic dysfunction of lumbar region: Secondary | ICD-10-CM | POA: Diagnosis not present

## 2016-09-01 DIAGNOSIS — M5412 Radiculopathy, cervical region: Secondary | ICD-10-CM | POA: Diagnosis not present

## 2016-09-01 DIAGNOSIS — M62830 Muscle spasm of back: Secondary | ICD-10-CM | POA: Diagnosis not present

## 2016-09-01 DIAGNOSIS — M9902 Segmental and somatic dysfunction of thoracic region: Secondary | ICD-10-CM | POA: Diagnosis not present

## 2016-09-01 DIAGNOSIS — M9901 Segmental and somatic dysfunction of cervical region: Secondary | ICD-10-CM | POA: Diagnosis not present

## 2016-09-01 DIAGNOSIS — M50322 Other cervical disc degeneration at C5-C6 level: Secondary | ICD-10-CM | POA: Diagnosis not present

## 2016-09-01 DIAGNOSIS — M542 Cervicalgia: Secondary | ICD-10-CM | POA: Diagnosis not present

## 2016-09-02 DIAGNOSIS — M50322 Other cervical disc degeneration at C5-C6 level: Secondary | ICD-10-CM | POA: Diagnosis not present

## 2016-09-02 DIAGNOSIS — M5412 Radiculopathy, cervical region: Secondary | ICD-10-CM | POA: Diagnosis not present

## 2016-09-02 DIAGNOSIS — M9902 Segmental and somatic dysfunction of thoracic region: Secondary | ICD-10-CM | POA: Diagnosis not present

## 2016-09-02 DIAGNOSIS — M542 Cervicalgia: Secondary | ICD-10-CM | POA: Diagnosis not present

## 2016-09-02 DIAGNOSIS — M9901 Segmental and somatic dysfunction of cervical region: Secondary | ICD-10-CM | POA: Diagnosis not present

## 2016-09-02 DIAGNOSIS — M62830 Muscle spasm of back: Secondary | ICD-10-CM | POA: Diagnosis not present

## 2016-09-02 DIAGNOSIS — M9903 Segmental and somatic dysfunction of lumbar region: Secondary | ICD-10-CM | POA: Diagnosis not present

## 2016-09-08 DIAGNOSIS — M9903 Segmental and somatic dysfunction of lumbar region: Secondary | ICD-10-CM | POA: Diagnosis not present

## 2016-09-08 DIAGNOSIS — M5412 Radiculopathy, cervical region: Secondary | ICD-10-CM | POA: Diagnosis not present

## 2016-09-08 DIAGNOSIS — M9901 Segmental and somatic dysfunction of cervical region: Secondary | ICD-10-CM | POA: Diagnosis not present

## 2016-09-08 DIAGNOSIS — M62830 Muscle spasm of back: Secondary | ICD-10-CM | POA: Diagnosis not present

## 2016-09-08 DIAGNOSIS — M50322 Other cervical disc degeneration at C5-C6 level: Secondary | ICD-10-CM | POA: Diagnosis not present

## 2016-09-08 DIAGNOSIS — M542 Cervicalgia: Secondary | ICD-10-CM | POA: Diagnosis not present

## 2016-09-08 DIAGNOSIS — M9902 Segmental and somatic dysfunction of thoracic region: Secondary | ICD-10-CM | POA: Diagnosis not present

## 2016-09-11 DIAGNOSIS — M50322 Other cervical disc degeneration at C5-C6 level: Secondary | ICD-10-CM | POA: Diagnosis not present

## 2016-09-11 DIAGNOSIS — M542 Cervicalgia: Secondary | ICD-10-CM | POA: Diagnosis not present

## 2016-09-11 DIAGNOSIS — M5412 Radiculopathy, cervical region: Secondary | ICD-10-CM | POA: Diagnosis not present

## 2016-09-11 DIAGNOSIS — M9902 Segmental and somatic dysfunction of thoracic region: Secondary | ICD-10-CM | POA: Diagnosis not present

## 2016-09-11 DIAGNOSIS — M62830 Muscle spasm of back: Secondary | ICD-10-CM | POA: Diagnosis not present

## 2016-09-11 DIAGNOSIS — M9903 Segmental and somatic dysfunction of lumbar region: Secondary | ICD-10-CM | POA: Diagnosis not present

## 2016-09-11 DIAGNOSIS — M9901 Segmental and somatic dysfunction of cervical region: Secondary | ICD-10-CM | POA: Diagnosis not present

## 2016-09-15 DIAGNOSIS — M9903 Segmental and somatic dysfunction of lumbar region: Secondary | ICD-10-CM | POA: Diagnosis not present

## 2016-09-15 DIAGNOSIS — M9901 Segmental and somatic dysfunction of cervical region: Secondary | ICD-10-CM | POA: Diagnosis not present

## 2016-09-15 DIAGNOSIS — M50322 Other cervical disc degeneration at C5-C6 level: Secondary | ICD-10-CM | POA: Diagnosis not present

## 2016-09-15 DIAGNOSIS — M5412 Radiculopathy, cervical region: Secondary | ICD-10-CM | POA: Diagnosis not present

## 2016-09-15 DIAGNOSIS — M9902 Segmental and somatic dysfunction of thoracic region: Secondary | ICD-10-CM | POA: Diagnosis not present

## 2016-09-15 DIAGNOSIS — M542 Cervicalgia: Secondary | ICD-10-CM | POA: Diagnosis not present

## 2016-09-15 DIAGNOSIS — M62830 Muscle spasm of back: Secondary | ICD-10-CM | POA: Diagnosis not present

## 2016-09-16 ENCOUNTER — Other Ambulatory Visit: Payer: Self-pay

## 2016-09-18 DIAGNOSIS — M9903 Segmental and somatic dysfunction of lumbar region: Secondary | ICD-10-CM | POA: Diagnosis not present

## 2016-09-18 DIAGNOSIS — M542 Cervicalgia: Secondary | ICD-10-CM | POA: Diagnosis not present

## 2016-09-18 DIAGNOSIS — M62830 Muscle spasm of back: Secondary | ICD-10-CM | POA: Diagnosis not present

## 2016-09-18 DIAGNOSIS — M50322 Other cervical disc degeneration at C5-C6 level: Secondary | ICD-10-CM | POA: Diagnosis not present

## 2016-09-18 DIAGNOSIS — M9901 Segmental and somatic dysfunction of cervical region: Secondary | ICD-10-CM | POA: Diagnosis not present

## 2016-09-18 DIAGNOSIS — M9902 Segmental and somatic dysfunction of thoracic region: Secondary | ICD-10-CM | POA: Diagnosis not present

## 2016-09-18 DIAGNOSIS — M5412 Radiculopathy, cervical region: Secondary | ICD-10-CM | POA: Diagnosis not present

## 2016-09-22 DIAGNOSIS — M9901 Segmental and somatic dysfunction of cervical region: Secondary | ICD-10-CM | POA: Diagnosis not present

## 2016-09-22 DIAGNOSIS — M5412 Radiculopathy, cervical region: Secondary | ICD-10-CM | POA: Diagnosis not present

## 2016-09-22 DIAGNOSIS — M50322 Other cervical disc degeneration at C5-C6 level: Secondary | ICD-10-CM | POA: Diagnosis not present

## 2016-09-22 DIAGNOSIS — M9902 Segmental and somatic dysfunction of thoracic region: Secondary | ICD-10-CM | POA: Diagnosis not present

## 2016-09-22 DIAGNOSIS — M542 Cervicalgia: Secondary | ICD-10-CM | POA: Diagnosis not present

## 2016-09-22 DIAGNOSIS — M62830 Muscle spasm of back: Secondary | ICD-10-CM | POA: Diagnosis not present

## 2016-09-22 DIAGNOSIS — M9903 Segmental and somatic dysfunction of lumbar region: Secondary | ICD-10-CM | POA: Diagnosis not present

## 2016-09-29 DIAGNOSIS — M9901 Segmental and somatic dysfunction of cervical region: Secondary | ICD-10-CM | POA: Diagnosis not present

## 2016-09-29 DIAGNOSIS — M5412 Radiculopathy, cervical region: Secondary | ICD-10-CM | POA: Diagnosis not present

## 2016-09-29 DIAGNOSIS — M50322 Other cervical disc degeneration at C5-C6 level: Secondary | ICD-10-CM | POA: Diagnosis not present

## 2016-09-29 DIAGNOSIS — M9902 Segmental and somatic dysfunction of thoracic region: Secondary | ICD-10-CM | POA: Diagnosis not present

## 2016-09-29 DIAGNOSIS — M542 Cervicalgia: Secondary | ICD-10-CM | POA: Diagnosis not present

## 2016-09-29 DIAGNOSIS — M62830 Muscle spasm of back: Secondary | ICD-10-CM | POA: Diagnosis not present

## 2016-09-29 DIAGNOSIS — M9903 Segmental and somatic dysfunction of lumbar region: Secondary | ICD-10-CM | POA: Diagnosis not present

## 2016-12-01 DIAGNOSIS — Z23 Encounter for immunization: Secondary | ICD-10-CM | POA: Diagnosis not present

## 2017-02-26 DIAGNOSIS — H52223 Regular astigmatism, bilateral: Secondary | ICD-10-CM | POA: Diagnosis not present

## 2017-02-26 DIAGNOSIS — H1852 Epithelial (juvenile) corneal dystrophy: Secondary | ICD-10-CM | POA: Diagnosis not present

## 2017-03-19 DIAGNOSIS — N3090 Cystitis, unspecified without hematuria: Secondary | ICD-10-CM | POA: Diagnosis not present

## 2017-03-19 DIAGNOSIS — N3001 Acute cystitis with hematuria: Secondary | ICD-10-CM | POA: Diagnosis not present

## 2017-03-19 DIAGNOSIS — R509 Fever, unspecified: Secondary | ICD-10-CM | POA: Diagnosis not present

## 2017-07-01 DIAGNOSIS — G500 Trigeminal neuralgia: Secondary | ICD-10-CM | POA: Diagnosis not present

## 2017-07-01 DIAGNOSIS — K219 Gastro-esophageal reflux disease without esophagitis: Secondary | ICD-10-CM | POA: Diagnosis not present

## 2017-07-01 DIAGNOSIS — E785 Hyperlipidemia, unspecified: Secondary | ICD-10-CM | POA: Diagnosis not present

## 2017-07-01 DIAGNOSIS — R739 Hyperglycemia, unspecified: Secondary | ICD-10-CM | POA: Diagnosis not present

## 2017-07-01 DIAGNOSIS — Z0000 Encounter for general adult medical examination without abnormal findings: Secondary | ICD-10-CM | POA: Diagnosis not present

## 2017-07-01 DIAGNOSIS — Z79899 Other long term (current) drug therapy: Secondary | ICD-10-CM | POA: Diagnosis not present

## 2017-07-01 DIAGNOSIS — I10 Essential (primary) hypertension: Secondary | ICD-10-CM | POA: Diagnosis not present

## 2017-07-20 DIAGNOSIS — Z1231 Encounter for screening mammogram for malignant neoplasm of breast: Secondary | ICD-10-CM | POA: Diagnosis not present

## 2017-07-24 DIAGNOSIS — A932 Colorado tick fever: Secondary | ICD-10-CM | POA: Diagnosis not present

## 2017-11-30 DIAGNOSIS — Z23 Encounter for immunization: Secondary | ICD-10-CM | POA: Diagnosis not present

## 2017-12-05 DIAGNOSIS — M545 Low back pain: Secondary | ICD-10-CM | POA: Diagnosis not present

## 2018-02-22 DIAGNOSIS — J0100 Acute maxillary sinusitis, unspecified: Secondary | ICD-10-CM | POA: Diagnosis not present

## 2018-03-15 DIAGNOSIS — N3001 Acute cystitis with hematuria: Secondary | ICD-10-CM | POA: Diagnosis not present

## 2018-03-15 DIAGNOSIS — N3091 Cystitis, unspecified with hematuria: Secondary | ICD-10-CM | POA: Diagnosis not present

## 2018-04-21 DIAGNOSIS — H524 Presbyopia: Secondary | ICD-10-CM | POA: Diagnosis not present

## 2018-04-21 DIAGNOSIS — H1852 Epithelial (juvenile) corneal dystrophy: Secondary | ICD-10-CM | POA: Diagnosis not present

## 2018-06-25 ENCOUNTER — Ambulatory Visit (HOSPITAL_COMMUNITY)
Admission: EM | Admit: 2018-06-25 | Discharge: 2018-06-25 | Disposition: A | Discharge status: Home / Self Care | Payer: Medicare Other | Attending: Family Medicine | Admitting: Family Medicine

## 2018-06-25 ENCOUNTER — Other Ambulatory Visit: Payer: Self-pay

## 2018-06-25 ENCOUNTER — Encounter (HOSPITAL_COMMUNITY): Payer: Self-pay | Admitting: Emergency Medicine

## 2018-06-25 ENCOUNTER — Ambulatory Visit (INDEPENDENT_AMBULATORY_CARE_PROVIDER_SITE_OTHER): Payer: Medicare Other

## 2018-06-25 DIAGNOSIS — R42 Dizziness and giddiness: Secondary | ICD-10-CM

## 2018-06-25 DIAGNOSIS — R0602 Shortness of breath: Secondary | ICD-10-CM | POA: Diagnosis not present

## 2018-06-25 DIAGNOSIS — E785 Hyperlipidemia, unspecified: Secondary | ICD-10-CM

## 2018-06-25 DIAGNOSIS — J22 Unspecified acute lower respiratory infection: Secondary | ICD-10-CM | POA: Diagnosis not present

## 2018-06-25 DIAGNOSIS — I10 Essential (primary) hypertension: Secondary | ICD-10-CM

## 2018-06-25 DIAGNOSIS — R05 Cough: Secondary | ICD-10-CM | POA: Diagnosis not present

## 2018-06-25 DIAGNOSIS — R062 Wheezing: Secondary | ICD-10-CM | POA: Diagnosis not present

## 2018-06-25 DIAGNOSIS — K219 Gastro-esophageal reflux disease without esophagitis: Secondary | ICD-10-CM

## 2018-06-25 HISTORY — DX: Pure hypercholesterolemia, unspecified: E78.00

## 2018-06-25 HISTORY — DX: Gastro-esophageal reflux disease without esophagitis: K21.9

## 2018-06-25 MED ORDER — ALBUTEROL SULFATE HFA 108 (90 BASE) MCG/ACT IN AERS: 1.0000 | INHALATION_SPRAY | Freq: Four times a day (QID) | RESPIRATORY_TRACT | 0 refills | Status: DC | PRN

## 2018-06-25 MED ORDER — PREDNISONE 20 MG PO TABS: 20.0000 mg | ORAL_TABLET | Freq: Two times a day (BID) | ORAL | 0 refills | Status: DC

## 2018-06-25 MED ORDER — AMOXICILLIN 875 MG PO TABS: 875.0000 mg | ORAL_TABLET | Freq: Two times a day (BID) | ORAL | 0 refills | Status: AC

## 2018-06-25 MED ORDER — BENZONATATE 100 MG PO CAPS: 100.0000 mg | ORAL_CAPSULE | Freq: Three times a day (TID) | ORAL | 0 refills | Status: DC

## 2018-06-25 NOTE — ED Provider Notes (Signed)
[place]    CSN: [id number] Arrival date & time: 06/25/18  7371     History   Chief Complaint Chief Complaint  Patient presents with  . Cough  . Shortness of Breath    HPI Wanda Patrick is a 83 y.o. female.   HPI  Wanda Patrick is a lovely 83 year old woman who is here today for cough and shortness of breath.  She states her symptoms started on about March 25.  She states for the first few days she had a low-grade temperature, under 100.  She is had coughing and shortness of breath ever since that time.  She is called her primary care doctor and was given a course of prednisone.  The prednisone made her feel better temporarily.  She has a friend down the street who has asthma.  This person gave her an albuterol inhaler to use.  She states that she has been using this daily.  She states she does not have underlying asthma, but she did use an inhaler for a couple of years previously.  She states she had wheezing after a bad respiratory infection.  Her husband was a smoker and she states that she was exposed to secondhand smoke for 35 years.  She lost him 27 years ago. She states that she has not been to church or out in Public since March 15.  She has been very careful to stay home, wear masks, and prevent any exposure to COVID-19.  Past Medical History:  Diagnosis Date  . GERD (gastroesophageal reflux disease)   . Hypercholesteremia     Patient Active Problem List   Diagnosis Date Noted  . Essential hypertension 06/25/2018  . HLD (hyperlipidemia) 06/25/2018  . GERD (gastroesophageal reflux disease) 06/25/2018  . Chronic vertigo 06/25/2018    History reviewed. No pertinent surgical history.  OB History   No obstetric history on file.      Home Medications    Prior to Admission medications   Medication Sig Start Date End Date Taking? Authorizing Provider  albuterol (VENTOLIN HFA) 108 (90 Base) MCG/ACT inhaler Inhale 1-2 puffs into the lungs every 6 (six) hours  as needed for wheezing or shortness of breath. 06/25/18   Raylene Everts, MD  amoxicillin (AMOXIL) 875 MG tablet Take 1 tablet (875 mg total) by mouth 2 (two) times daily for 10 days. 06/25/18 07/05/18  Raylene Everts, MD  benzonatate (TESSALON) 100 MG capsule Take 1 capsule (100 mg total) by mouth every 8 (eight) hours. 06/25/18   Raylene Everts, MD  gabapentin (NEURONTIN) 300 MG capsule Take 300 mg by mouth daily.    [provider]  losartan (COZAAR) 50 MG tablet Take 50 mg by mouth daily.    [provider]  meclizine (ANTIVERT) 25 MG tablet Take 25 mg by mouth 3 (three) times daily as needed for dizziness.    [provider]  omeprazole (PRILOSEC) 20 MG capsule Take 20 mg by mouth daily.    [provider]  predniSONE (DELTASONE) 20 MG tablet Take 1 tablet (20 mg total) by mouth 2 (two) times daily with a meal. 06/25/18   Raylene Everts, MD  simvastatin (ZOCOR) 20 MG tablet Take 20 mg by mouth daily.    [provider]    Family History History reviewed. No pertinent family history.  Social History Social History   Tobacco Use  . Smoking status: Never Smoker  . Smokeless tobacco: Never Used  Substance Use Topics  .  Alcohol use: Never    Alcohol/week: 0.0 standard drinks    Frequency: Never  . Drug use: Never     Allergies   Erythromycin; Macrodantin [nitrofurantoin]; and Sulfa antibiotics   Review of Systems Review of Systems  Constitutional: Negative for chills, fatigue and fever.  HENT: Negative for ear pain and sore throat.   Eyes: Negative for pain and visual disturbance.  Respiratory: Positive for cough, shortness of breath and wheezing.   Cardiovascular: Negative for chest pain and palpitations.  Gastrointestinal: Negative for abdominal pain and vomiting.  Genitourinary: Negative for dysuria and hematuria.  Musculoskeletal: Negative for arthralgias and back pain.  Skin: Negative for color change and rash.   Neurological: Negative for seizures and syncope.  All other systems reviewed and are negative.    Physical Exam Triage Vital Signs ED Triage Vitals [06/25/18 0837]  Enc Vitals Group     BP (!) 144/102     Pulse Rate (!) 102     Resp 18     Temp 98.2 F (36.8 C)     Temp Source Temporal     SpO2 97 %   No data found.  Updated Vital Signs BP (!) 150/83 (BP Location: Right Arm)   Pulse 91   Temp 98.2 F (36.8 C) (Temporal)   Resp 18   LMP  (LMP Unknown)   SpO2 94%       Physical Exam Constitutional:      General: She is not in acute distress.    Appearance: She is well-developed and normal weight.  HENT:     Head: Normocephalic and atraumatic.     Mouth/Throat:     Mouth: Mucous membranes are moist.  Eyes:     Conjunctiva/sclera: Conjunctivae normal.     Pupils: Pupils are equal, round, and reactive to light.  Neck:     Musculoskeletal: Normal range of motion.  Cardiovascular:     Rate and Rhythm: Normal rate.     Heart sounds: Normal heart sounds.  Pulmonary:     Effort: Pulmonary effort is normal. No respiratory distress.     Breath sounds: Examination of the right-upper field reveals wheezing. Examination of the left-upper field reveals wheezing. Examination of the left-middle field reveals wheezing. Examination of the right-lower field reveals wheezing. Examination of the left-lower field reveals wheezing. Wheezing present.     Comments: Inspiratory wheeze throughout Chest:     Chest wall: No tenderness.  Abdominal:     General: There is no distension.     Palpations: Abdomen is soft.  Musculoskeletal: Normal range of motion.  Lymphadenopathy:     Cervical: No cervical adenopathy.  Skin:    General: Skin is warm and dry.  Neurological:     General: No focal deficit present.     Mental Status: She is alert.  Psychiatric:        Mood and Affect: Mood normal.        Behavior: Behavior normal.     Comments: Tearful when discussing her husband.   Talkative.  Pleasant      UC Treatments / Results  Labs (all labs ordered are listed, but only abnormal results are displayed) Labs Reviewed - No data to display  EKG None  Radiology Dg Chest 2 View  Result Date: 06/25/2018 CLINICAL DATA:  Nonproductive cough and shortness of breath since 05/19/2018. EXAM: CHEST - 2 VIEW COMPARISON:  None. FINDINGS: Lungs are adequately inflated without consolidation or effusion. Cardiomediastinal silhouette is within normal. Minimal degenerative  change of the spine. Mild anterior wedging of a midthoracic vertebral body likely chronic. IMPRESSION: No acute cardiopulmonary disease. Mild anterior wedging of a midthoracic vertebral body likely chronic. Electronically Signed   By: Marin Olp M.D.   On: 06/25/2018 09:16    Procedures Procedures (including critical care time)  Medications Ordered in UC Medications - No data to display  Initial Impression / Assessment and Plan / UC Course  I have reviewed the triage vital signs and the nursing notes.  Pertinent labs & imaging results that were available during my care of the patient were reviewed by me and considered in my medical decision making (see chart for details).     I believe the patient is having a post viral syndrome with bronchial formation and wheezing.  With 4 weeks of cough and congestion, it is reasonable to cover with an antibiotic.  And when to repeat her prednisone.  I am giving her an albuterol inhaler.  She is to follow-up with her PCP.  I told her to expect improvement over the next several days to a week Final Clinical Impressions(s) / UC Diagnoses   Final diagnoses:  Wheezing  LRTI (lower respiratory tract infection)     Discharge Instructions     Push fluids Take the antibiotic 2 times a day I am prescribing Tessalon, a pill for coughing. Take the prednisone 2 times a day for 5 days Use albuterol as needed for wheezing.  You may use it every 4-6 hours. Follow-up  with your primary care doctor      ED Prescriptions    Medication Sig Dispense Auth. Provider   predniSONE (DELTASONE) 20 MG tablet Take 1 tablet (20 mg total) by mouth 2 (two) times daily with a meal. 10 tablet Raylene Everts, MD   albuterol (VENTOLIN HFA) 108 (90 Base) MCG/ACT inhaler Inhale 1-2 puffs into the lungs every 6 (six) hours as needed for wheezing or shortness of breath. 1 Inhaler Raylene Everts, MD   benzonatate (TESSALON) 100 MG capsule Take 1 capsule (100 mg total) by mouth every 8 (eight) hours. 21 capsule Raylene Everts, MD   amoxicillin (AMOXIL) 875 MG tablet Take 1 tablet (875 mg total) by mouth 2 (two) times daily for 10 days. 20 tablet Raylene Everts, MD     Controlled Substance Prescriptions [place] Controlled Substance Registry consulted? Not Applicable   Raylene Everts, MD 06/25/18 1058

## 2018-06-25 NOTE — Discharge Instructions (Signed)
Push fluids Take the antibiotic 2 times a day I am prescribing Tessalon, a pill for coughing. Take the prednisone 2 times a day for 5 days Use albuterol as needed for wheezing.  You may use it every 4-6 hours. Follow-up with your primary care doctor

## 2018-06-25 NOTE — ED Triage Notes (Signed)
Pt here for cough and SOB x 1 month; pt sts took some prednisone from here PCP but not improved

## 2018-07-05 DIAGNOSIS — R739 Hyperglycemia, unspecified: Secondary | ICD-10-CM | POA: Diagnosis not present

## 2018-07-05 DIAGNOSIS — K219 Gastro-esophageal reflux disease without esophagitis: Secondary | ICD-10-CM | POA: Diagnosis not present

## 2018-07-05 DIAGNOSIS — M8580 Other specified disorders of bone density and structure, unspecified site: Secondary | ICD-10-CM | POA: Diagnosis not present

## 2018-07-05 DIAGNOSIS — Z0000 Encounter for general adult medical examination without abnormal findings: Secondary | ICD-10-CM | POA: Diagnosis not present

## 2018-07-05 DIAGNOSIS — E785 Hyperlipidemia, unspecified: Secondary | ICD-10-CM | POA: Diagnosis not present

## 2018-07-05 DIAGNOSIS — I10 Essential (primary) hypertension: Secondary | ICD-10-CM | POA: Diagnosis not present

## 2018-07-05 DIAGNOSIS — Z79899 Other long term (current) drug therapy: Secondary | ICD-10-CM | POA: Diagnosis not present

## 2018-07-14 ENCOUNTER — Encounter: Payer: Self-pay | Admitting: Allergy and Immunology

## 2018-07-14 ENCOUNTER — Ambulatory Visit (INDEPENDENT_AMBULATORY_CARE_PROVIDER_SITE_OTHER): Payer: Medicare Other | Admitting: Allergy and Immunology

## 2018-07-14 ENCOUNTER — Other Ambulatory Visit: Payer: Self-pay

## 2018-07-14 VITALS — BP 160/88 | HR 84 | Temp 97.8°F | Resp 18 | Ht 66.0 in | Wt 172.0 lb

## 2018-07-14 DIAGNOSIS — K219 Gastro-esophageal reflux disease without esophagitis: Secondary | ICD-10-CM | POA: Diagnosis not present

## 2018-07-14 DIAGNOSIS — J3089 Other allergic rhinitis: Secondary | ICD-10-CM | POA: Diagnosis not present

## 2018-07-14 DIAGNOSIS — J4550 Severe persistent asthma, uncomplicated: Secondary | ICD-10-CM | POA: Diagnosis not present

## 2018-07-14 MED ORDER — FAMOTIDINE 40 MG PO TABS: ORAL_TABLET | ORAL | 5 refills | Status: DC

## 2018-07-14 MED ORDER — METHYLPREDNISOLONE ACETATE 80 MG/ML IJ SUSP
80.0000 mg | Freq: Once | INTRAMUSCULAR | Status: AC
  Administered 2018-07-14: 80 mg via INTRAMUSCULAR

## 2018-07-14 MED ORDER — TIOTROPIUM BROMIDE MONOHYDRATE 1.25 MCG/ACT IN AERS: 2.0000 | INHALATION_SPRAY | Freq: Every day | RESPIRATORY_TRACT | 5 refills | Status: DC

## 2018-07-14 MED ORDER — OMEPRAZOLE 40 MG PO CPDR: DELAYED_RELEASE_CAPSULE | ORAL | 5 refills | Status: DC

## 2018-07-14 MED ORDER — BUDESONIDE-FORMOTEROL FUMARATE 160-4.5 MCG/ACT IN AERO: INHALATION_SPRAY | RESPIRATORY_TRACT | 5 refills | Status: DC

## 2018-07-14 NOTE — Progress Notes (Signed)
[facility] [facility]   NEW PATIENT NOTE  Referring Provider: No ref. provider found Primary Provider: Street, Sharon Mt, MD Date of office visit: 07/14/2018    Subjective:   Chief Complaint:  Wanda Patrick (DOB: 01-21-1936) is a 83 y.o. female who presents to the clinic on 07/14/2018 with a chief complaint of Asthma; Cough; and Shortness of Breath .  HPI: Wanda Patrick presents to this clinic in evaluation of breathing problems.  Wanda Patrick has a several year history dating back to 05/21/04 of intermittently having issues with coughing and wheezing.  She states that she gets "bronchitis" 1 or 2 times per year.  Her pattern changed significantly this spring.  Starting in May 22, 2022 she has developed issues with coughing and wheezing and shortness of breath that has not resolved even in the face of utilizing 2 courses of systemic steroids and going to the emergency room on one occasion on 25 Jun 2018.  She does not have any chest pain or sputum production but she definitely gets short of breath whenever she exerts herself.  She is presently using a short acting bronchodilator several times per day including both daytime and nighttime.  She has minimal nasal symptoms with this issue and not a particularly large amount of nasal congestion or sneezing or ugly nasal discharge or inability to smell or taste.  She does have reflux disease even in the face of utilizing omeprazole on a consistent basis.  She has indigestion and burping especially if she eats late or eats a big meal.  She drinks 2 coffees per day and 2 teas per day and intermittently eats chocolate.  She did have a history of extensive tobacco smoke exposure as she grew up in a family of smokers and her husband smoked until he died of lung cancer 05-22-91.  Since that point in time she has not had any tobacco smoke exposure.  Past Medical History:  Diagnosis Date  . GERD (gastroesophageal reflux disease)   .  Hypercholesteremia     History reviewed. No pertinent surgical history.  Allergies as of 07/14/2018      Reactions   Erythromycin    Macrodantin [nitrofurantoin]    Sulfa Antibiotics       Medication List    albuterol 108 (90 Base) MCG/ACT inhaler Commonly known as:  VENTOLIN HFA Inhale 1-2 puffs into the lungs every 6 (six) hours as needed for wheezing or shortness of breath.   gabapentin 300 MG capsule Commonly known as:  NEURONTIN Take 300 mg by mouth daily.   losartan 50 MG tablet Commonly known as:  COZAAR Take 50 mg by mouth daily.   meclizine 25 MG tablet Commonly known as:  ANTIVERT Take 25 mg by mouth 3 (three) times daily as needed for dizziness.   omeprazole 20 MG capsule Commonly known as:  PRILOSEC Take 20 mg by mouth daily.   simvastatin 20 MG tablet Commonly known as:  ZOCOR Take 20 mg by mouth daily.       Review of systems negative except as noted in HPI / PMHx or noted below:  Review of Systems  Constitutional: Negative.   HENT: Negative.   Eyes: Negative.   Respiratory: Negative.   Cardiovascular: Negative.   Gastrointestinal: Negative.   Genitourinary: Negative.   Musculoskeletal: Negative.   Skin: Negative.   Neurological: Negative.   Endo/Heme/Allergies: Negative.   Psychiatric/Behavioral: Negative.     History reviewed. No pertinent family history.  Social  History   Socioeconomic History  . Marital status: Widowed    Spouse name: Not on file  . Number of children: Not on file  . Years of education: Not on file  . Highest education level: Not on file  Occupational History  . Not on file  Social Needs  . Financial resource strain: Not on file  . Food insecurity:    Worry: Not on file    Inability: Not on file  . Transportation needs:    Medical: Not on file    Non-medical: Not on file  Tobacco Use  . Smoking status: Passive Smoke Exposure - Never Smoker  . Smokeless tobacco: Never Used  . Tobacco comment: Husband was  a smoker, died of lung cancer 27 years ago. Father was also a smoker and smoked around her.  Substance and Sexual Activity  . Alcohol use: Never    Alcohol/week: 0.0 standard drinks    Frequency: Never  . Drug use: Never  . Sexual activity: Not on file  Lifestyle  . Physical activity:    Days per week: Not on file    Minutes per session: Not on file  . Stress: Not on file  Relationships  . Social connections:    Talks on phone: Not on file    Gets together: Not on file    Attends religious service: Not on file    Active member of club or organization: Not on file    Attends meetings of clubs or organizations: Not on file    Relationship status: Not on file  . Intimate partner violence:    Fear of current or ex partner: Not on file    Emotionally abused: Not on file    Physically abused: Not on file    Forced sexual activity: Not on file  Other Topics Concern  . Not on file  Social History Narrative  . Not on file    Environmental and Social history  Lives in a house with a dry environment, no animals located inside the household, carpet in the bedroom, no plastic on the bed, no plastic on the pillow, no smokers located inside the household.  Objective:   Vitals:   07/14/18 1410  BP: (!) 160/88  Pulse: 84  Resp: 18  Temp: 97.8 F (36.6 C)  SpO2: 94%   Height: 5\' 6"  (167.6 cm) Weight: 172 lb (78 kg)  Physical Exam Constitutional:      Appearance: She is not diaphoretic.  HENT:     Head: Normocephalic. No right periorbital erythema or left periorbital erythema.     Right Ear: Tympanic membrane, ear canal and external ear normal.     Left Ear: Tympanic membrane, ear canal and external ear normal.     Nose: Nose normal. No mucosal edema or rhinorrhea.     Mouth/Throat:     Pharynx: No oropharyngeal exudate.  Eyes:     General: Lids are normal.     Conjunctiva/sclera: Conjunctivae normal.     Pupils: Pupils are equal, round, and reactive to light.  Neck:      Thyroid: No thyromegaly.     Trachea: Trachea normal. No tracheal deviation.  Cardiovascular:     Rate and Rhythm: Normal rate and regular rhythm.     Heart sounds: Normal heart sounds, S1 normal and S2 normal. No murmur.  Pulmonary:     Effort: Pulmonary effort is normal. No respiratory distress.     Breath sounds: No stridor. No wheezing (Bilateral expiratory  wheezes all lung fields) or rales.  Chest:     Chest wall: No tenderness.  Abdominal:     General: There is no distension.     Palpations: Abdomen is soft. There is no mass.     Tenderness: There is no abdominal tenderness. There is no guarding or rebound.  Musculoskeletal:        General: No tenderness.  Lymphadenopathy:     Head:     Right side of head: No tonsillar adenopathy.     Left side of head: No tonsillar adenopathy.     Cervical: No cervical adenopathy.  Skin:    Coloration: Skin is not pale.     Findings: No erythema or rash.     Nails: There is no clubbing.   Neurological:     Mental Status: She is alert.     Diagnostics: Allergy skin tests were performed.  She demonstrated hypersensitivity to mold.  Spirometry was performed and demonstrated an FEV1 of 1.21 @ 56 % of predicted. FEV1/FVC = 0.94  Results of a chest x-ray obtained 25 Jun 2018 identified the following:  Lungs are adequately inflated without consolidation or effusion. Cardiomediastinal silhouette is within normal. Minimal degenerative change of the spine. Mild anterior wedging of a midthoracic vertebral body likely chronic.  Assessment and Plan:    1. Not well controlled severe persistent asthma   2. Perennial allergic rhinitis   3. Gastroesophageal reflux disease, esophagitis presence not specified     1.  Allergen avoidance measures  2.  Treat and prevent inflammation:   A. Symbicort 160 - 2 inhalations 2 times per day with spacer  B. Spiriva 1.25 Respimat - 2 inhalations 1 time per day  C. Depomedrol 80 mg IM administered in  clinic today  3.  Treat and prevent reflux:   A. Consolidate caffeine and chocolate consumption  B. Increase omeprazole 40 mg in AM  C. Famotidine 40 mg in PM  D. No late meals, raise head of bed  4.  If needed:   A. Albuterol HFA - 2 inhalations every 4-6 hours  5.  Return to clinic in 2 weeks or earlier if problem  Albina has significant inflammation of her airway and also appears to have active reflux.  We will address both these issues with the therapy noted above which includes anti-inflammatory agents for her airway and more aggressive treatment directed against reflux and see her back in this clinic in 2 weeks to assess her response and consider further evaluation and treatment based upon this response.  Allena Katz, MD Allergy / Immunology [place]

## 2018-07-14 NOTE — Patient Instructions (Addendum)
  1.  Allergen avoidance measures  2.  Treat and prevent inflammation:   A. Symbicort 160 - 2 inhalations 2 times per day with spacer  B. Spiriva 1.25 Respimat - 2 inhalations 1 time per day  C. Depomedrol 80 mg IM administered in clinic today  3.  Treat and prevent reflux:   A. Consolidate caffeine and chocolate consumption  B. Increase omeprazole 40 mg in AM  C. Famotidine 40 mg in PM  D. No late meals, raise head of bed  4.  If needed:   A. Albuterol HFA - 2 inhalations every 4-6 hours  5.  Return to clinic in 2 weeks or earlier if problem

## 2018-07-15 ENCOUNTER — Encounter: Payer: Self-pay | Admitting: Allergy and Immunology

## 2018-07-29 ENCOUNTER — Other Ambulatory Visit: Payer: Self-pay

## 2018-07-29 ENCOUNTER — Encounter: Payer: Self-pay | Admitting: Allergy and Immunology

## 2018-07-29 ENCOUNTER — Ambulatory Visit (INDEPENDENT_AMBULATORY_CARE_PROVIDER_SITE_OTHER): Payer: Medicare Other | Admitting: Allergy and Immunology

## 2018-07-29 VITALS — BP 150/72 | HR 68 | Temp 98.5°F | Resp 16

## 2018-07-29 DIAGNOSIS — J4550 Severe persistent asthma, uncomplicated: Secondary | ICD-10-CM | POA: Diagnosis not present

## 2018-07-29 DIAGNOSIS — K219 Gastro-esophageal reflux disease without esophagitis: Secondary | ICD-10-CM | POA: Diagnosis not present

## 2018-07-29 DIAGNOSIS — R5383 Other fatigue: Secondary | ICD-10-CM

## 2018-07-29 DIAGNOSIS — J3089 Other allergic rhinitis: Secondary | ICD-10-CM | POA: Diagnosis not present

## 2018-07-29 MED ORDER — INCRUSE ELLIPTA 62.5 MCG/INH IN AEPB: 1.0000 | INHALATION_SPRAY | Freq: Every day | RESPIRATORY_TRACT | 5 refills | Status: DC

## 2018-07-29 NOTE — Progress Notes (Signed)
[facility] [place]   Follow-up Note   Referring Provider: Street, Sharon Mt, * Primary Provider: Street, Sharon Mt, MD Date of Office Visit: 07/29/2018  Subjective:   Wanda Patrick (DOB: 1935-09-24) is a 83 y.o. female who returns to the [facility] on 07/29/2018 in re-evaluation of the following:  HPI: Camani returns to this clinic in evaluation of severe asthma, allergic rhinitis, and reflux.  Her last evaluation was her initial evaluation of 14 Jul 2018 at which point in time we established a plan directed against both respiratory tract inflammation and reflux.  She is so much better at this point in time.  She can now sleep through the night and does not need to use a short acting bronchodilator and has no shortness of breath or wheezing.  However, she does have continued coughing spells where she feels as though she cannot clear out her throat or her lungs with lots of throat clearing.    In addition, all of her indigestion and burping has resolved.    She has no nasal symptoms.  Insurance will not cover Spiriva.  She has had very "low energy" over the course of the past several months.  Apparently she did have blood test performed on 05 Jul 2018 with Dr. Venetia Maxon.  Allergies as of 07/29/2018      Reactions   Erythromycin    Macrodantin [nitrofurantoin]    Sulfa Antibiotics       Medication List      albuterol 108 (90 Base) MCG/ACT inhaler Commonly known as:  VENTOLIN HFA Inhale 1-2 puffs into the lungs every 6 (six) hours as needed for wheezing or shortness of breath.   budesonide-formoterol 160-4.5 MCG/ACT inhaler Commonly known as:  Symbicort Inhale 2 puffs into the lungs twice daily with spacer   famotidine 40 MG tablet Commonly known as:  PEPCID Take 1 tablet by mouth once daily in the evening   gabapentin 300 MG capsule Commonly known as:  NEURONTIN Take 300 mg by mouth daily.   losartan 50 MG  tablet Commonly known as:  COZAAR Take 50 mg by mouth daily.   meclizine 25 MG tablet Commonly known as:  ANTIVERT Take 25 mg by mouth 3 (three) times daily as needed for dizziness.   omeprazole 40 MG capsule Commonly known as:  PRILOSEC Take 1 capsule by mouth once daily in the morning   simvastatin 20 MG tablet Commonly known as:  ZOCOR Take 20 mg by mouth daily.       Past Medical History:  Diagnosis Date  . GERD (gastroesophageal reflux disease)   . Hypercholesteremia     History reviewed. No pertinent surgical history.  Review of systems negative except as noted in HPI / PMHx or noted below:  Review of Systems  Constitutional: Negative.   HENT: Negative.   Eyes: Negative.   Respiratory: Negative.   Cardiovascular: Negative.   Gastrointestinal: Negative.   Genitourinary: Negative.   Musculoskeletal: Negative.   Skin: Negative.   Neurological: Negative.   Endo/Heme/Allergies: Negative.   Psychiatric/Behavioral: Negative.      Objective:   Vitals:   07/29/18 0959  BP: (!) 150/72  Pulse: 68  Resp: 16  Temp: 98.5 F (36.9 C)  SpO2: 96%          Physical Exam Constitutional:      Appearance: She is not diaphoretic.  HENT:     Head: Normocephalic.     Right  Ear: Tympanic membrane, ear canal and external ear normal.     Left Ear: Tympanic membrane, ear canal and external ear normal.     Nose: Nose normal. No mucosal edema or rhinorrhea.     Mouth/Throat:     Pharynx: Uvula midline. No oropharyngeal exudate.  Eyes:     Conjunctiva/sclera: Conjunctivae normal.  Neck:     Thyroid: No thyromegaly.     Trachea: Trachea normal. No tracheal tenderness or tracheal deviation.  Cardiovascular:     Rate and Rhythm: Normal rate and regular rhythm.     Heart sounds: Normal heart sounds, S1 normal and S2 normal. No murmur.  Pulmonary:     Effort: No respiratory distress.     Breath sounds: No stridor. Wheezing (Scattered inspiratory and expiratory wheezes  all lung fields) present. No rales.  Lymphadenopathy:     Head:     Right side of head: No tonsillar adenopathy.     Left side of head: No tonsillar adenopathy.     Cervical: No cervical adenopathy.  Skin:    Findings: No erythema or rash.     Nails: There is no clubbing.   Neurological:     Mental Status: She is alert.     Diagnostics:    Spirometry was performed and demonstrated an FEV1 of 1.76 at 83 % of predicted.  Her previous FEV1 was 1.21.   Assessment and Plan:   1. Not well controlled severe persistent asthma   2. Perennial allergic rhinitis   3. Gastroesophageal reflux disease, esophagitis presence not specified   4. Other fatigue     1.  Continue to Treat and prevent inflammation:   A. Symbicort 160 - 2 inhalations 2 times per day with spacer  B. INCRUSE - 1 inhalation 1 time per day  2.  Continue to Treat and prevent reflux:   A. Consolidate caffeine and chocolate consumption  B. Omeprazole 40 mg in AM  C. Famotidine 40 mg in PM  D. No late meals, raise head of bed  3.  If needed:   A. Albuterol HFA - 2 inhalations every 4-6 hours  4. Please bring blood test results from Dr. Venetia Maxon. More testing?  5.  Return to clinic in 6 weeks or earlier if problem  Viveca appears to be doing better but certainly has continued inflammation of her airway on her current plan.  We will get her to use an anticholinergic agent on a consistent basis in addition to her long-acting bronchodilator inhaled steroid.  She will continue to treat reflux.  I will review the blood test that Dr. Venetia Maxon has obtained recently and consider more testing for not just her inflammation but also the fact that she has developed fatigue since March 2020.  Allena Katz, MD Allergy / Immunology [place]

## 2018-07-29 NOTE — Patient Instructions (Addendum)
  1.  Continue to Treat and prevent inflammation:   A. Symbicort 160 - 2 inhalations 2 times per day with spacer  B. INCRUSE - 1 inhalations 1 time per day  2.  Continue to Treat and prevent reflux:   A. Consolidate caffeine and chocolate consumption  B. Omeprazole 40 mg in AM  C. Famotidine 40 mg in PM  D. No late meals, raise head of bed  3.  If needed:   A. Albuterol HFA - 2 inhalations every 4-6 hours  4. Please bring blood test results from Dr. Venetia Maxon. More testing?  5.  Return to clinic in 6 weeks or earlier if problem

## 2018-08-02 ENCOUNTER — Encounter: Payer: Self-pay | Admitting: Allergy and Immunology

## 2018-08-03 ENCOUNTER — Other Ambulatory Visit: Payer: Self-pay | Admitting: *Deleted

## 2018-08-03 DIAGNOSIS — J4550 Severe persistent asthma, uncomplicated: Secondary | ICD-10-CM

## 2018-08-03 DIAGNOSIS — E871 Hypo-osmolality and hyponatremia: Secondary | ICD-10-CM

## 2018-08-04 LAB — BASIC METABOLIC PANEL
BUN/Creatinine Ratio: 10 — ABNORMAL LOW (ref 12–28)
BUN: 10 mg/dL (ref 8–27)
CO2: 26 mmol/L (ref 20–29)
Calcium: 9.3 mg/dL (ref 8.7–10.3)
Chloride: 99 mmol/L (ref 96–106)
Creatinine, Ser: 0.98 mg/dL (ref 0.57–1.00)
GFR calc Af Amer: 63 mL/min/{1.73_m2} (ref >59)
GFR calc non Af Amer: 54 mL/min/{1.73_m2} — ABNORMAL LOW (ref >59)
Glucose: 115 mg/dL — ABNORMAL HIGH (ref 65–99)
Potassium: 4.6 mmol/L (ref 3.5–5.2)
Sodium: 133 mmol/L — ABNORMAL LOW (ref 134–144)

## 2018-08-04 LAB — CBC WITH DIFFERENTIAL/PLATELET
Basophils Absolute: 0 10*3/uL (ref 0.0–0.2)
Basos: 0 %
EOS (ABSOLUTE): 0.2 10*3/uL (ref 0.0–0.4)
Eos: 2 %
Hematocrit: 35.2 % (ref 34.0–46.6)
Hemoglobin: 12 g/dL (ref 11.1–15.9)
Lymphocytes Absolute: 1.8 10*3/uL (ref 0.7–3.1)
Lymphs: 23 %
MCH: 30.7 pg (ref 26.6–33.0)
MCHC: 34.1 g/dL (ref 31.5–35.7)
MCV: 90 fL (ref 79–97)
Monocytes Absolute: 0.7 10*3/uL (ref 0.1–0.9)
Monocytes: 9 %
Neutrophils Absolute: 5.1 10*3/uL (ref 1.4–7.0)
Neutrophils: 66 %
Platelets: 197 10*3/uL (ref 150–450)
RBC: 3.91 x10E6/uL (ref 3.77–5.28)
RDW: 14.2 % (ref 11.7–15.4)
WBC: 7.8 10*3/uL (ref 3.4–10.8)

## 2018-09-02 ENCOUNTER — Other Ambulatory Visit: Payer: Self-pay

## 2018-09-02 ENCOUNTER — Encounter: Payer: Self-pay | Admitting: Allergy and Immunology

## 2018-09-02 ENCOUNTER — Ambulatory Visit (INDEPENDENT_AMBULATORY_CARE_PROVIDER_SITE_OTHER): Payer: Medicare Other | Admitting: Allergy and Immunology

## 2018-09-02 VITALS — BP 150/88 | HR 63 | Temp 97.8°F | Resp 16

## 2018-09-02 DIAGNOSIS — R5383 Other fatigue: Secondary | ICD-10-CM

## 2018-09-02 DIAGNOSIS — J4550 Severe persistent asthma, uncomplicated: Secondary | ICD-10-CM | POA: Diagnosis not present

## 2018-09-02 DIAGNOSIS — J3089 Other allergic rhinitis: Secondary | ICD-10-CM | POA: Diagnosis not present

## 2018-09-02 DIAGNOSIS — K219 Gastro-esophageal reflux disease without esophagitis: Secondary | ICD-10-CM | POA: Diagnosis not present

## 2018-09-02 DIAGNOSIS — E871 Hypo-osmolality and hyponatremia: Secondary | ICD-10-CM

## 2018-09-02 NOTE — Progress Notes (Signed)
[place] [facility] [place]   Follow-up Note  Referring Provider: Street, Sharon Mt, * Primary Provider: Street, Sharon Mt, MD Date of Office Visit: 09/02/2018  Subjective:   Wanda Patrick (DOB: [birth year]) is a 83 y.o. female who returns to the [place] on 09/02/2018 in re-evaluation of the following:  HPI: Wanda Patrick returns to this clinic in evaluation of severe asthma and allergic rhinitis and reflux.  I have not seen her in this clinic since 29 July 2018.  During her last visit she was doing quite well although she still continued to have some cough and we introduced Incruse to her anti-inflammatory agents and now she has absolutely no respiratory tract symptoms including cough.  She does not need to use a short acting bronchodilator and she can exert herself without any problem.  She has no issues with her nose.  She has no issues with reflux.  She still continues to drink 1 coffee per day.  We also attempted to address her issue with "low energy" and fatigue during her last visit.  We collected her blood tests from her family physician and it identified a sodium of 131 and we repeated that sodium level and it came back 133.  It appears as though her TSH was normal.  Allergies as of 09/02/2018      Reactions   Erythromycin    Macrodantin [nitrofurantoin]    Sulfa Antibiotics       Medication List      albuterol 108 (90 Base) MCG/ACT inhaler Commonly known as: VENTOLIN HFA Inhale 1-2 puffs into the lungs every 6 (six) hours as needed for wheezing or shortness of breath.   budesonide-formoterol 160-4.5 MCG/ACT inhaler Commonly known as: Symbicort Inhale 2 puffs into the lungs twice daily with spacer   famotidine 40 MG tablet Commonly known as: PEPCID Take 1 tablet by mouth once daily in the evening   gabapentin 300 MG capsule Commonly known as: NEURONTIN Take 300 mg by mouth daily.   Incruse Ellipta 62.5 MCG/INH  Aepb Generic drug: umeclidinium bromide Inhale 1 puff into the lungs daily.   losartan 50 MG tablet Commonly known as: COZAAR Take 50 mg by mouth daily.   meclizine 25 MG tablet Commonly known as: ANTIVERT Take 25 mg by mouth 3 (three) times daily as needed for dizziness.   omeprazole 40 MG capsule Commonly known as: PRILOSEC Take 1 capsule by mouth once daily in the morning   simvastatin 20 MG tablet Commonly known as: ZOCOR Take 20 mg by mouth daily.       Past Medical History:  Diagnosis Date  . GERD (gastroesophageal reflux disease)   . Hypercholesteremia     History reviewed. No pertinent surgical history.  Review of systems negative except as noted in HPI / PMHx or noted below:  Review of Systems  Constitutional: Negative.   HENT: Negative.   Eyes: Negative.   Respiratory: Negative.   Cardiovascular: Negative.   Gastrointestinal: Negative.   Genitourinary: Negative.   Musculoskeletal: Negative.   Skin: Negative.   Neurological: Negative.   Endo/Heme/Allergies: Negative.   Psychiatric/Behavioral: Negative.      Objective:   Vitals:   09/02/18 1045  BP: (!) 150/88  Pulse: 63  Resp: 16  Temp: 97.8 F (36.6 C)  SpO2: 97%          Physical Exam Constitutional:      Appearance: She is not diaphoretic.  HENT:  Head: Normocephalic.     Right Ear: Tympanic membrane, ear canal and external ear normal.     Left Ear: Tympanic membrane, ear canal and external ear normal.     Nose: Nose normal. No mucosal edema or rhinorrhea.     Mouth/Throat:     Pharynx: Uvula midline. No oropharyngeal exudate.  Eyes:     Conjunctiva/sclera: Conjunctivae normal.  Neck:     Thyroid: No thyromegaly.     Trachea: Trachea normal. No tracheal tenderness or tracheal deviation.  Cardiovascular:     Rate and Rhythm: Normal rate and regular rhythm.     Heart sounds: Normal heart sounds, S1 normal and S2 normal. No murmur.  Pulmonary:     Effort: No respiratory  distress.     Breath sounds: Normal breath sounds. No stridor. No wheezing or rales.  Lymphadenopathy:     Head:     Right side of head: No tonsillar adenopathy.     Left side of head: No tonsillar adenopathy.     Cervical: No cervical adenopathy.  Skin:    Findings: No erythema or rash.     Nails: There is no clubbing.   Neurological:     Mental Status: She is alert.     Diagnostics:    Spirometry was performed and demonstrated an FEV1 of 1.85 at 88 % of predicted.   Review of blood tests obtained 05 Jul 2018 identified sodium 131 mEq/L, TSH 2.126 IU U/mL  Results of blood tests obtained 03 August 2018 identifies sodium 133 mmol/L, creatinine 0.98 mg/DL, WBC 7.8, absolute eosinophil 200, absolute lymphocyte 1800, hemoglobin 12 platelet 197.  Assessment and Plan:   1. Asthma, severe persistent, well-controlled   2. Perennial allergic rhinitis   3. Gastroesophageal reflux disease, esophagitis presence not specified   4. Other fatigue   5. Hyponatremia     1.  Continue to Treat and prevent inflammation:   A. SYMBICORT 160 - 2 inhalations 2 times per day with spacer  B. INCRUSE - 1 inhalations 1 time per day  2.  Continue to Treat and prevent reflux:   A. Consolidate caffeine and chocolate consumption  B. Omeprazole 40 mg in AM  C. Famotidine 40 mg in PM  D. No late meals, raise head of bed  3.  If needed:   A. Albuterol HFA - 2 inhalations every 4-6 hours  4. Return to clinic in October 2020 or earlier if problem  5. Obtain fall flu vaccine (and COVID vaccine)  6. Further evaluation for fatigue / slightly low sodium?  Wanda Patrick is doing very well regarding her airway while addressing insults of inflammation and reflux as noted above.  We will keep her on this plan for the next 12 weeks and I will see her back in this clinic in October 2020 or earlier if there is a problem.  Her other major issue is fatigue and she does have a slightly low sodium but I am not going to  have her undergo any further evaluation from this clinic regarding these issues and I am going to refer her back to her primary care doctor to address this issue of fatigue.  Allena Katz, MD Allergy / Immunology [place]

## 2018-09-02 NOTE — Patient Instructions (Addendum)
  1.  Continue to Treat and prevent inflammation:   A. SYMBICORT 160 - 2 inhalations 2 times per day with spacer  B. INCRUSE - 1 inhalations 1 time per day  2.  Continue to Treat and prevent reflux:   A. Consolidate caffeine and chocolate consumption  B. Omeprazole 40 mg in AM  C. Famotidine 40 mg in PM  D. No late meals, raise head of bed  3.  If needed:   A. Albuterol HFA - 2 inhalations every 4-6 hours  4. Return to clinic in October 2020 or earlier if problem  5. Obtain fall flu vaccine (and COVID vaccine)  6. Further evaluation for fatigue / slightly low sodium?

## 2018-09-06 ENCOUNTER — Encounter: Payer: Self-pay | Admitting: Allergy and Immunology

## 2018-12-04 DIAGNOSIS — Z23 Encounter for immunization: Secondary | ICD-10-CM | POA: Diagnosis not present

## 2018-12-06 DIAGNOSIS — N3001 Acute cystitis with hematuria: Secondary | ICD-10-CM | POA: Diagnosis not present

## 2018-12-06 DIAGNOSIS — N3091 Cystitis, unspecified with hematuria: Secondary | ICD-10-CM | POA: Diagnosis not present

## 2018-12-15 ENCOUNTER — Other Ambulatory Visit: Payer: Self-pay

## 2018-12-15 ENCOUNTER — Ambulatory Visit (INDEPENDENT_AMBULATORY_CARE_PROVIDER_SITE_OTHER): Payer: Medicare Other | Admitting: Allergy and Immunology

## 2018-12-15 ENCOUNTER — Encounter: Payer: Self-pay | Admitting: Allergy and Immunology

## 2018-12-15 VITALS — BP 132/82 | HR 61 | Resp 18

## 2018-12-15 DIAGNOSIS — J3089 Other allergic rhinitis: Secondary | ICD-10-CM | POA: Diagnosis not present

## 2018-12-15 DIAGNOSIS — K219 Gastro-esophageal reflux disease without esophagitis: Secondary | ICD-10-CM | POA: Diagnosis not present

## 2018-12-15 DIAGNOSIS — J4550 Severe persistent asthma, uncomplicated: Secondary | ICD-10-CM

## 2018-12-15 NOTE — Progress Notes (Signed)
[facility] [place]   Follow-up Note  Referring Provider: Street, Sharon Mt, * Primary Provider: Street, Sharon Mt, MD Date of Office Visit: 12/15/2018  Subjective:   Wanda Patrick (DOB: Jun 06, 1935) is a 83 y.o. female who returns to the [facility] on 12/15/2018 in re-evaluation of the following:  HPI: Leny returns to this clinic in evaluation of severe asthma and allergic rhinitis and reflux.  Her last visit to this clinic was 02 September 2018.  She continues to do wonderful on her current plan of therapy.  She has had no cough and no shortness of breath and she can exercise without any problem and does not use a short acting bronchodilator and has had no issues with her nose and no issues with reflux.  She continues on a combination ICS/LABA inhaler as well as a LAMA and continues on a combination of therapy directed against reflux with a proton pump inhibitor and H2 receptor blocker.  She did obtain her flu vaccine.  Allergies as of 12/15/2018      Reactions   Erythromycin    Macrodantin [nitrofurantoin]    Sulfa Antibiotics       Medication List      albuterol 108 (90 Base) MCG/ACT inhaler Commonly known as: VENTOLIN HFA Inhale 1-2 puffs into the lungs every 6 (six) hours as needed for wheezing or shortness of breath.   budesonide-formoterol 160-4.5 MCG/ACT inhaler Commonly known as: Symbicort Inhale 2 puffs into the lungs twice daily with spacer   famotidine 40 MG tablet Commonly known as: PEPCID Take 1 tablet by mouth once daily in the evening   gabapentin 300 MG capsule Commonly known as: NEURONTIN Take 300 mg by mouth daily.   Incruse Ellipta 62.5 MCG/INH Aepb Generic drug: umeclidinium bromide Inhale 1 puff into the lungs daily.   losartan 50 MG tablet Commonly known as: COZAAR Take 50 mg by mouth daily.   meclizine 25 MG tablet Commonly known as: ANTIVERT Take 25 mg by mouth 3 (three) times  daily as needed for dizziness.   omeprazole 40 MG capsule Commonly known as: PRILOSEC Take 1 capsule by mouth once daily in the morning   simvastatin 20 MG tablet Commonly known as: ZOCOR Take 20 mg by mouth daily.       Past Medical History:  Diagnosis Date  . Asthma   . GERD (gastroesophageal reflux disease)   . Hypercholesteremia     History reviewed. No pertinent surgical history.  Review of systems negative except as noted in HPI / PMHx or noted below:  Review of Systems  Constitutional: Negative.   HENT: Negative.   Eyes: Negative.   Respiratory: Negative.   Cardiovascular: Negative.   Gastrointestinal: Negative.   Genitourinary: Negative.   Musculoskeletal: Negative.   Skin: Negative.   Neurological: Negative.   Endo/Heme/Allergies: Negative.   Psychiatric/Behavioral: Negative.      Objective:   Vitals:   12/15/18 1107  BP: 132/82  Pulse: 61  Resp: 18  SpO2: 96%          Physical Exam Constitutional:      Appearance: She is not diaphoretic.  HENT:     Head: Normocephalic.     Right Ear: Tympanic membrane, ear canal and external ear normal.     Left Ear: Tympanic membrane, ear canal and external ear normal.     Nose: Nose normal. No mucosal edema or rhinorrhea.     Mouth/Throat:  Pharynx: Uvula midline. No oropharyngeal exudate.  Eyes:     Conjunctiva/sclera: Conjunctivae normal.  Neck:     Thyroid: No thyromegaly.     Trachea: Trachea normal. No tracheal tenderness or tracheal deviation.  Cardiovascular:     Rate and Rhythm: Normal rate and regular rhythm.     Heart sounds: Normal heart sounds, S1 normal and S2 normal. No murmur.  Pulmonary:     Effort: No respiratory distress.     Breath sounds: Normal breath sounds. No stridor. No wheezing or rales.  Lymphadenopathy:     Head:     Right side of head: No tonsillar adenopathy.     Left side of head: No tonsillar adenopathy.     Cervical: No cervical adenopathy.  Skin:     Findings: No erythema or rash.     Nails: There is no clubbing.   Neurological:     Mental Status: She is alert.     Diagnostics:    Spirometry was performed and demonstrated an FEV1 of 2.26 at 107 % of predicted.  Assessment and Plan:   1. Asthma, severe persistent, well-controlled   2. Perennial allergic rhinitis   3. LPRD (laryngopharyngeal reflux disease)     1.  Continue to Treat and prevent inflammation:   A. SYMBICORT 160 - 2 inhalations 2 times per day with spacer  B. INCRUSE - 1 inhalations 1 time per day  2.  Continue to Treat and prevent reflux:   A. Consolidate caffeine and chocolate consumption  B. Omeprazole 40 mg in AM  C.  Attempt to discontinue famotidine 40 mg in PM  3.  If needed:   A. Albuterol HFA - 2 inhalations every 4-6 hours  4. Return to clinic in 3 months or earlier if problem  5. Obtain COVID vaccine when available  Kayson is really doing quite well on her current therapy and there may be an opportunity to consolidate some of her treatment.  We will initially start by eliminating the use of famotidine in the evening.  Assuming she does well with this plan I will see her back in this clinic in 3 months or earlier if there is a problem.  Allena Katz, MD Allergy / Immunology [place]

## 2018-12-15 NOTE — Patient Instructions (Addendum)
  1.  Continue to Treat and prevent inflammation:   A. SYMBICORT 160 - 2 inhalations 2 times per day with spacer  B. INCRUSE - 1 inhalations 1 time per day  2.  Continue to Treat and prevent reflux:   A. Consolidate caffeine and chocolate consumption  B. Omeprazole 40 mg in AM  C.  Attempt to discontinue famotidine 40 mg in PM  3.  If needed:   A. Albuterol HFA - 2 inhalations every 4-6 hours  4. Return to clinic in 3 months or earlier if problem  5. Obtain COVID vaccine when available

## 2018-12-16 ENCOUNTER — Encounter: Payer: Self-pay | Admitting: Allergy and Immunology

## 2018-12-17 DIAGNOSIS — N3090 Cystitis, unspecified without hematuria: Secondary | ICD-10-CM | POA: Diagnosis not present

## 2019-03-17 ENCOUNTER — Ambulatory Visit (INDEPENDENT_AMBULATORY_CARE_PROVIDER_SITE_OTHER): Payer: Medicare Other | Admitting: Allergy and Immunology

## 2019-03-17 ENCOUNTER — Encounter: Payer: Self-pay | Admitting: Allergy and Immunology

## 2019-03-17 ENCOUNTER — Other Ambulatory Visit: Payer: Self-pay

## 2019-03-17 VITALS — BP 144/74 | HR 72 | Temp 97.2°F | Resp 14

## 2019-03-17 DIAGNOSIS — K219 Gastro-esophageal reflux disease without esophagitis: Secondary | ICD-10-CM

## 2019-03-17 DIAGNOSIS — J4550 Severe persistent asthma, uncomplicated: Secondary | ICD-10-CM

## 2019-03-17 DIAGNOSIS — J3089 Other allergic rhinitis: Secondary | ICD-10-CM | POA: Diagnosis not present

## 2019-03-17 MED ORDER — ALBUTEROL SULFATE HFA 108 (90 BASE) MCG/ACT IN AERS: INHALATION_SPRAY | RESPIRATORY_TRACT | 1 refills | Status: DC

## 2019-03-17 MED ORDER — BUDESONIDE-FORMOTEROL FUMARATE 160-4.5 MCG/ACT IN AERO: INHALATION_SPRAY | RESPIRATORY_TRACT | 1 refills | Status: DC

## 2019-03-17 NOTE — Patient Instructions (Addendum)
  1.  Continue to Treat and prevent inflammation:   A. SYMBICORT 160 - 2 inhalations 2 times per day with spacer  2.  Continue to Treat and prevent reflux:   A. Consolidate caffeine and chocolate consumption  B. Omeprazole 40 mg in AM  3.  If needed:   A. Albuterol HFA - 2 inhalations every 4-6 hours  4. Return to clinic in 6 months or earlier if problem  5. Obtain COVID vaccine

## 2019-03-17 NOTE — Progress Notes (Signed)
[facility] [place]   Follow-up Note  Referring Provider: Street, Sharon Mt, * Primary Provider: Street, Sharon Mt, MD Date of Office Visit: 03/17/2019  Subjective:   Wanda Patrick (DOB: 10/14/1935) is a 84 y.o. female who returns to the [facility] on 03/17/2019 in re-evaluation of the following:  HPI: Eastynn returns to this clinic in reevaluation of severe asthma and allergic rhinitis and reflux.  Her last visit to this clinic was 15 December 2018.  She has continued to do very well regarding her asthma and has no significant respiratory tract symptoms and was able to discontinue her LAMA and now is just using an ICS/LABA inhaler on a consistent basis without the need for short acting bronchodilator.  She has not been having any issues with reflux and she has not been having any issues with her throat.  Her nose is doing very well.  Allergies as of 03/17/2019      Reactions   Erythromycin    Macrodantin [nitrofurantoin]    Sulfa Antibiotics       Medication List    albuterol 108 (90 Base) MCG/ACT inhaler Commonly known as: VENTOLIN HFA Inhale 1-2 puffs into the lungs every 6 (six) hours as needed for wheezing or shortness of breath.   budesonide-formoterol 160-4.5 MCG/ACT inhaler Commonly known as: Symbicort Inhale 2 puffs into the lungs twice daily with spacer   fluticasone 50 MCG/ACT nasal spray Commonly known as: FLONASE SMARTSIG:1 Spray(s) Both Nares Twice Daily PRN   gabapentin 300 MG capsule Commonly known as: NEURONTIN Take 300 mg by mouth daily.   Incruse Ellipta 62.5 MCG/INH Aepb Generic drug: umeclidinium bromide Inhale 1 puff into the lungs daily.   losartan 50 MG tablet Commonly known as: COZAAR Take 50 mg by mouth daily.   meclizine 25 MG tablet Commonly known as: ANTIVERT Take 25 mg by mouth 3 (three) times daily as needed for dizziness.   omeprazole 40 MG capsule Commonly known as:  PRILOSEC Take 1 capsule by mouth once daily in the morning   simvastatin 40 MG tablet Commonly known as: ZOCOR TAKE 1 2 (ONE HALF) TABLET BY MOUTH EVERY DAY AT BEDTIME   triamcinolone cream 0.1 % Commonly known as: KENALOG APPLY CREAM EXTERNALLY TWICE DAILY AS NEEDED TO AFFECTED SKIN       Past Medical History:  Diagnosis Date  . Asthma   . GERD (gastroesophageal reflux disease)   . Hypercholesteremia     History reviewed. No pertinent surgical history.  Review of systems negative except as noted in HPI / PMHx or noted below:  Review of Systems  Constitutional: Negative.   HENT: Negative.   Eyes: Negative.   Respiratory: Negative.   Cardiovascular: Negative.   Gastrointestinal: Negative.   Genitourinary: Negative.   Musculoskeletal: Negative.   Skin: Negative.   Neurological: Negative.   Endo/Heme/Allergies: Negative.   Psychiatric/Behavioral: Negative.      Objective:   Vitals:   03/17/19 1102  BP: (!) 144/74  Pulse: 72  Resp: 14  Temp: (!) 97.2 F (36.2 C)  SpO2: 98%          Physical Exam Constitutional:      Appearance: She is not diaphoretic.  HENT:     Head: Normocephalic.     Right Ear: Tympanic membrane, ear canal and external ear normal.     Left Ear: Tympanic membrane, ear canal and external ear normal.     Nose: Nose normal.  No mucosal edema or rhinorrhea.     Mouth/Throat:     Pharynx: Uvula midline. No oropharyngeal exudate.  Eyes:     Conjunctiva/sclera: Conjunctivae normal.  Neck:     Thyroid: No thyromegaly.     Trachea: Trachea normal. No tracheal tenderness or tracheal deviation.  Cardiovascular:     Rate and Rhythm: Normal rate and regular rhythm.     Heart sounds: Normal heart sounds, S1 normal and S2 normal. No murmur.  Pulmonary:     Effort: No respiratory distress.     Breath sounds: Normal breath sounds. No stridor. No wheezing or rales.  Lymphadenopathy:     Head:     Right side of head: No tonsillar adenopathy.      Left side of head: No tonsillar adenopathy.     Cervical: No cervical adenopathy.  Skin:    Findings: No erythema or rash.     Nails: There is no clubbing.  Neurological:     Mental Status: She is alert.     Diagnostics:    Spirometry was performed and demonstrated an FEV1 of 2.23 at 106 % of predicted.  Assessment and Plan:   1. Asthma, severe persistent, well-controlled   2. Perennial allergic rhinitis   3. LPRD (laryngopharyngeal reflux disease)     1.  Continue to Treat and prevent inflammation:   A. SYMBICORT 160 - 2 inhalations 2 times per day with spacer  2.  Continue to Treat and prevent reflux:   A. Consolidate caffeine and chocolate consumption  B. Omeprazole 40 mg in AM  3.  If needed:   A. Albuterol HFA - 2 inhalations every 4-6 hours  4. Return to clinic in 6 months or earlier if problem  5. Obtain COVID vaccine   Tarnesha is really doing well at this point in time and she will continue to use anti-inflammatory agents for her airway and therapy directed against reflux and contact me should she have significant problems in the face of this therapy.  We been able to taper down her medications significantly over the course of the past several months after she resolved the bulk of her respiratory tract inflammation and irritation.  I will see her back in this clinic in 6 months or earlier if there is a problem  Allena Katz, MD Allergy / [place]

## 2019-03-21 ENCOUNTER — Encounter: Payer: Self-pay | Admitting: Allergy and Immunology

## 2019-07-13 DIAGNOSIS — R739 Hyperglycemia, unspecified: Secondary | ICD-10-CM | POA: Diagnosis not present

## 2019-07-13 DIAGNOSIS — Z79899 Other long term (current) drug therapy: Secondary | ICD-10-CM | POA: Diagnosis not present

## 2019-07-13 DIAGNOSIS — I10 Essential (primary) hypertension: Secondary | ICD-10-CM | POA: Diagnosis not present

## 2019-07-13 DIAGNOSIS — E785 Hyperlipidemia, unspecified: Secondary | ICD-10-CM | POA: Diagnosis not present

## 2019-07-13 DIAGNOSIS — Z0000 Encounter for general adult medical examination without abnormal findings: Secondary | ICD-10-CM | POA: Diagnosis not present

## 2019-07-13 DIAGNOSIS — M8580 Other specified disorders of bone density and structure, unspecified site: Secondary | ICD-10-CM | POA: Diagnosis not present

## 2019-07-13 DIAGNOSIS — Z23 Encounter for immunization: Secondary | ICD-10-CM | POA: Diagnosis not present

## 2019-07-13 DIAGNOSIS — H8110 Benign paroxysmal vertigo, unspecified ear: Secondary | ICD-10-CM | POA: Diagnosis not present

## 2019-08-10 DIAGNOSIS — H31001 Unspecified chorioretinal scars, right eye: Secondary | ICD-10-CM | POA: Diagnosis not present

## 2019-08-10 DIAGNOSIS — H524 Presbyopia: Secondary | ICD-10-CM | POA: Diagnosis not present

## 2019-08-10 DIAGNOSIS — Z961 Presence of intraocular lens: Secondary | ICD-10-CM | POA: Diagnosis not present

## 2019-09-14 ENCOUNTER — Ambulatory Visit (INDEPENDENT_AMBULATORY_CARE_PROVIDER_SITE_OTHER): Payer: Medicare Other | Admitting: Allergy and Immunology

## 2019-09-14 ENCOUNTER — Encounter: Payer: Self-pay | Admitting: Allergy and Immunology

## 2019-09-14 ENCOUNTER — Other Ambulatory Visit: Payer: Self-pay

## 2019-09-14 VITALS — BP 142/78 | HR 64 | Resp 16

## 2019-09-14 DIAGNOSIS — L989 Disorder of the skin and subcutaneous tissue, unspecified: Secondary | ICD-10-CM

## 2019-09-14 DIAGNOSIS — K219 Gastro-esophageal reflux disease without esophagitis: Secondary | ICD-10-CM | POA: Diagnosis not present

## 2019-09-14 DIAGNOSIS — J4550 Severe persistent asthma, uncomplicated: Secondary | ICD-10-CM | POA: Diagnosis not present

## 2019-09-14 DIAGNOSIS — L308 Other specified dermatitis: Secondary | ICD-10-CM | POA: Diagnosis not present

## 2019-09-14 DIAGNOSIS — J3089 Other allergic rhinitis: Secondary | ICD-10-CM

## 2019-09-14 NOTE — Progress Notes (Signed)
[facility] [place]   Follow-up Note  Referring Provider: Street, Sharon Mt, * Primary Provider: Street, Sharon Mt, MD Date of Office Visit: 09/14/2019  Subjective:   Wanda Patrick (DOB: [birth year]) is a 84 y.o. female who returns to the [facility] on 09/14/2019 in re-evaluation of the following:  HPI: Wanda Patrick returns to this clinic in evaluation of severe asthma and allergic rhinitis and reflux. I last saw her in this clinic on 17 Mar 2019.  She has really done very well with her airway issue not requiring a systemic steroid or an antibiotic for any type of airway problem. Rarely does she use a short acting bronchodilator while using Symbicort mostly one time per day. She can walk on level ground with no problem at all although she does get a little bit short of breath if she walks up a hill.  Her throat issue and reflux issue are under excellent control now that she has completely eliminated all caffeine and chocolate consumption and continues currently on omeprazole 20 mg/day.  She received two [facility] Covid vaccinations. Interestingly, after her 1st vaccination she developed a pruritic dermatitis that exacerbated after her 2nd injection and she recently visited with [facility] dermatology and received a steroid injection on 23 Aug 2019 along with some steroid cream. She is much better as a result of that injection.  Allergies as of 09/14/2019      Reactions   Erythromycin    Macrodantin [nitrofurantoin]    Sulfa Antibiotics       Medication List    albuterol 108 (90 Base) MCG/ACT inhaler Commonly known as: VENTOLIN HFA Can inhale two puffs every four to six hours as needed for cough or wheeze.   budesonide-formoterol 160-4.5 MCG/ACT inhaler Commonly known as: Symbicort Inhale 2 puffs into the lungs twice daily with spacer.  Rinse, gargle, and spit after use.   gabapentin 300 MG capsule Commonly known as:  NEURONTIN Take 300 mg by mouth daily.   losartan 50 MG tablet Commonly known as: COZAAR Take 50 mg by mouth daily.   meclizine 25 MG tablet Commonly known as: ANTIVERT Take 25 mg by mouth 3 (three) times daily as needed for dizziness.   omeprazole 20 MG capsule Commonly known as: PRILOSEC Take 20 mg by mouth every morning.   simvastatin 40 MG tablet Commonly known as: ZOCOR TAKE 1 2 (ONE HALF) TABLET BY MOUTH EVERY DAY AT BEDTIME   triamcinolone cream 0.1 % Commonly known as: KENALOG APPLY CREAM EXTERNALLY TWICE DAILY AS NEEDED TO AFFECTED SKIN       Past Medical History:  Diagnosis Date  . Asthma   . GERD (gastroesophageal reflux disease)   . Hypercholesteremia     History reviewed. No pertinent surgical history.  Review of systems negative except as noted in HPI / PMHx or noted below:  Review of Systems  Constitutional: Negative.   HENT: Negative.   Eyes: Negative.   Respiratory: Negative.   Cardiovascular: Negative.   Gastrointestinal: Negative.   Genitourinary: Negative.   Musculoskeletal: Negative.   Skin: Negative.   Neurological: Negative.   Endo/Heme/Allergies: Negative.   Psychiatric/Behavioral: Negative.      Objective:   Vitals:   09/14/19 1101  BP: (!) 142/78  Pulse: 64  Resp: 16  SpO2: 98%          Physical Exam Constitutional:      Appearance: She is not diaphoretic.  HENT:  Head: Normocephalic.     Right Ear: Tympanic membrane, ear canal and external ear normal.     Left Ear: Tympanic membrane, ear canal and external ear normal.     Nose: Nose normal. No mucosal edema or rhinorrhea.     Mouth/Throat:     Pharynx: Uvula midline. No oropharyngeal exudate.  Eyes:     Conjunctiva/sclera: Conjunctivae normal.  Neck:     Thyroid: No thyromegaly.     Trachea: Trachea normal. No tracheal tenderness or tracheal deviation.  Cardiovascular:     Rate and Rhythm: Normal rate and regular rhythm.     Heart sounds: Normal heart  sounds, S1 normal and S2 normal. No murmur heard.   Pulmonary:     Effort: No respiratory distress.     Breath sounds: Normal breath sounds. No stridor. No wheezing or rales.  Lymphadenopathy:     Head:     Right side of head: No tonsillar adenopathy.     Left side of head: No tonsillar adenopathy.     Cervical: No cervical adenopathy.  Skin:    Findings: No erythema or rash.     Nails: There is no clubbing.  Neurological:     Mental Status: She is alert.     Diagnostics:    Spirometry was performed and demonstrated an FEV1 of 2.39 at 115 % of predicted.   Assessment and Plan:   1. Asthma, severe persistent, well-controlled   2. Perennial allergic rhinitis   3. LPRD (laryngopharyngeal reflux disease)   4. Inflammatory dermatosis     1.  Continue to Treat and prevent inflammation:   A. SYMBICORT 160 - 2 inhalations 1-2 times per day with spacer  2.  Continue to Treat and prevent reflux:   A. Consolidate caffeine and chocolate consumption  B. Omeprazole 20 mg in AM  3.  If needed:   A. Albuterol HFA - 2 inhalations every 4-6 hours  4. Return to clinic in 6 months or earlier if problem  5. Obtain flu vaccine  6. Further evaluation of dermatitis?  [name] appears to be doing very well from a respiratory standpoint and she will continue on anti-inflammatory agents for her airway and therapy directed against reflux as noted above. Concerning her inflammatory dermatosis, hopefully this immunological hyperreactivity will abate the further she gets away from her Covid vaccine. Certainly she had a very good response to the administration of a systemic steroid from [facility] with very little visual dermatitis identified during today's exam. If she redevelops significant pruritic dermatitis in the future she may require further evaluation to exclude other possible systemic diseases contributing to this issue.  Allena Katz, MD Allergy / Immunology [place]

## 2019-09-14 NOTE — Patient Instructions (Signed)
  1.  Continue to Treat and prevent inflammation:   A. SYMBICORT 160 - 2 inhalations 1-2 times per day with spacer  2.  Continue to Treat and prevent reflux:   A. Consolidate caffeine and chocolate consumption  B. Omeprazole 20 mg in AM  3.  If needed:   A. Albuterol HFA - 2 inhalations every 4-6 hours  4. Return to clinic in 6 months or earlier if problem  5. Obtain flu vaccine  6. Further evaluation of dermatitis?

## 2019-09-15 ENCOUNTER — Encounter: Payer: Self-pay | Admitting: Allergy and Immunology

## 2019-10-14 DIAGNOSIS — N390 Urinary tract infection, site not specified: Secondary | ICD-10-CM | POA: Diagnosis not present

## 2019-10-14 DIAGNOSIS — L299 Pruritus, unspecified: Secondary | ICD-10-CM | POA: Diagnosis not present

## 2019-10-14 DIAGNOSIS — N3001 Acute cystitis with hematuria: Secondary | ICD-10-CM | POA: Diagnosis not present

## 2019-12-22 IMAGING — DX CHEST - 2 VIEW
2 series · 2 of 2 positions shown · non-contrast
Comparison: None.

CLINICAL DATA: Nonproductive cough and shortness of breath since
05/19/2018.

EXAM:
CHEST - 2 VIEW

[chest pa]
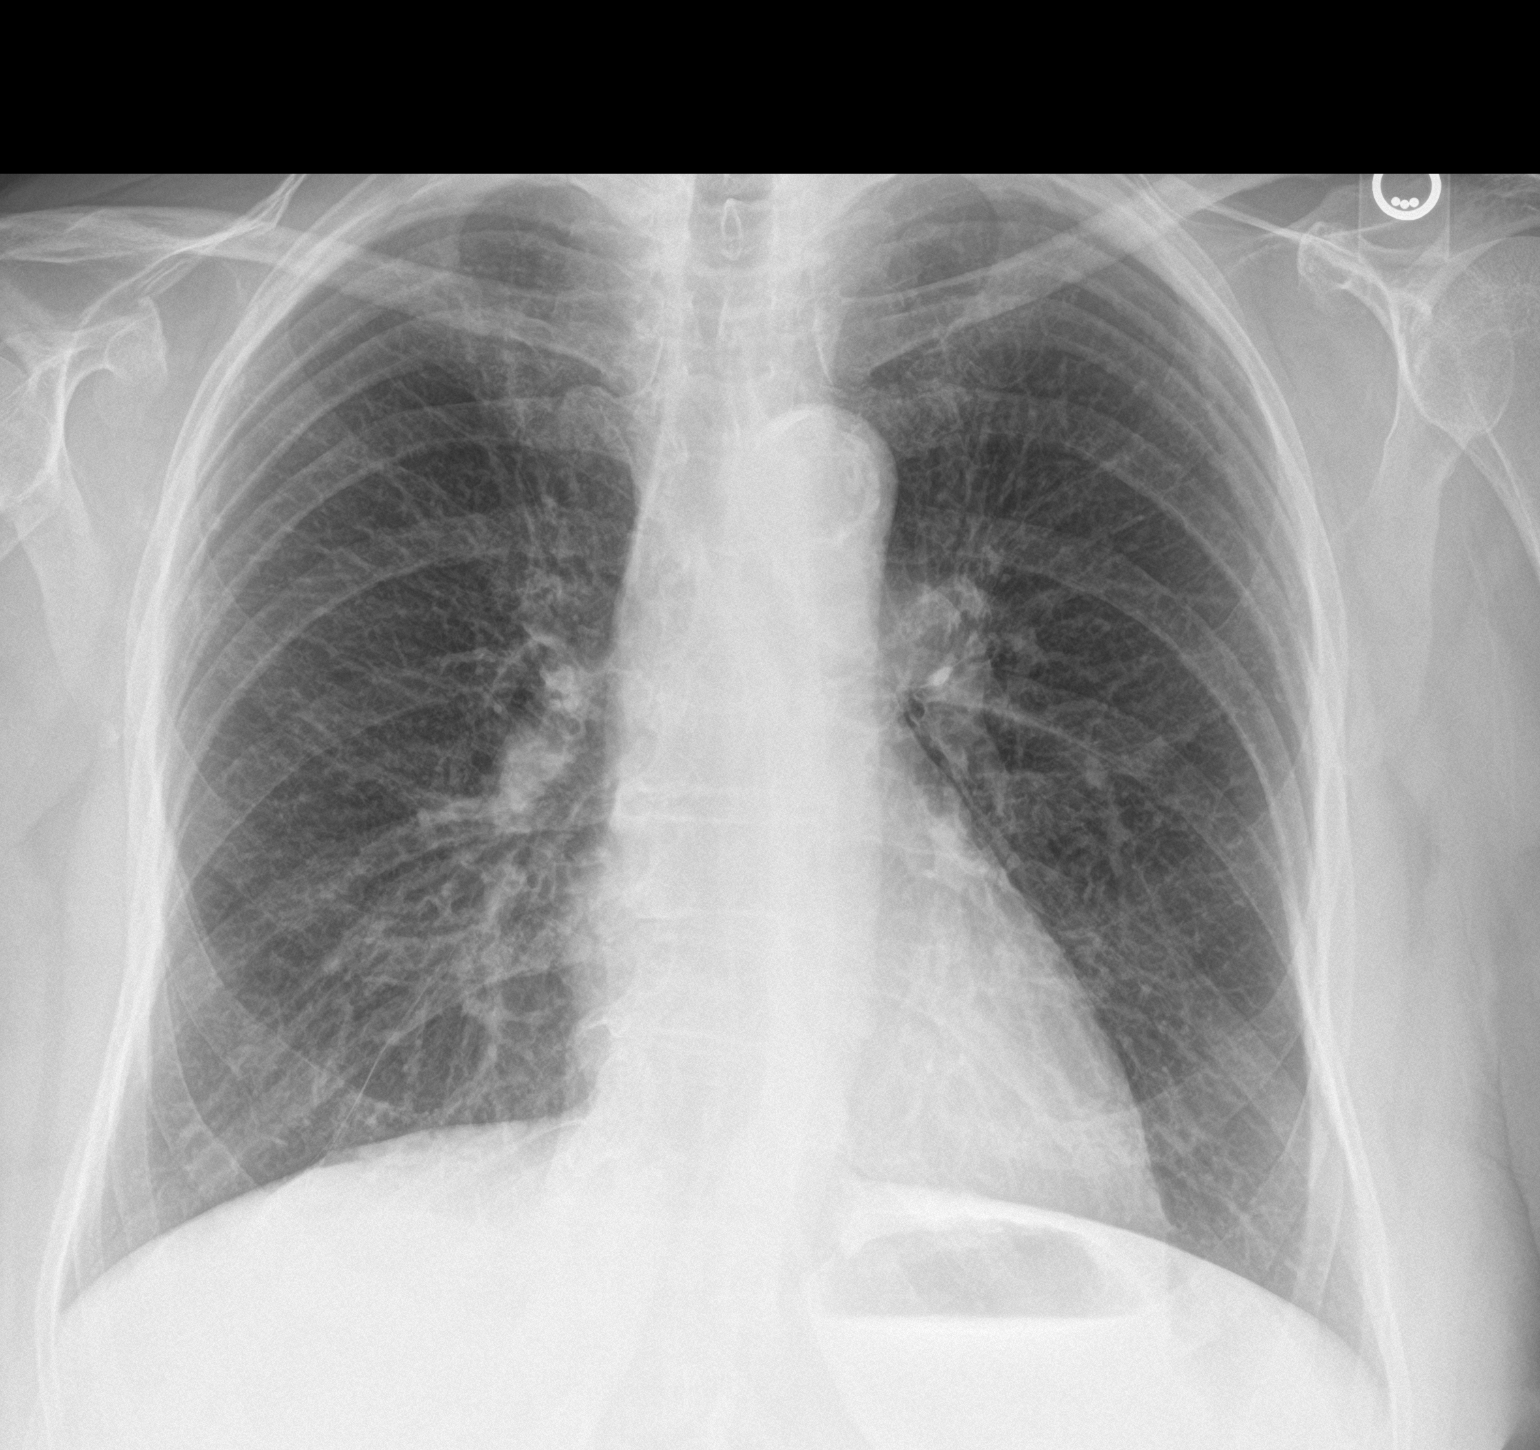

[chest lat]
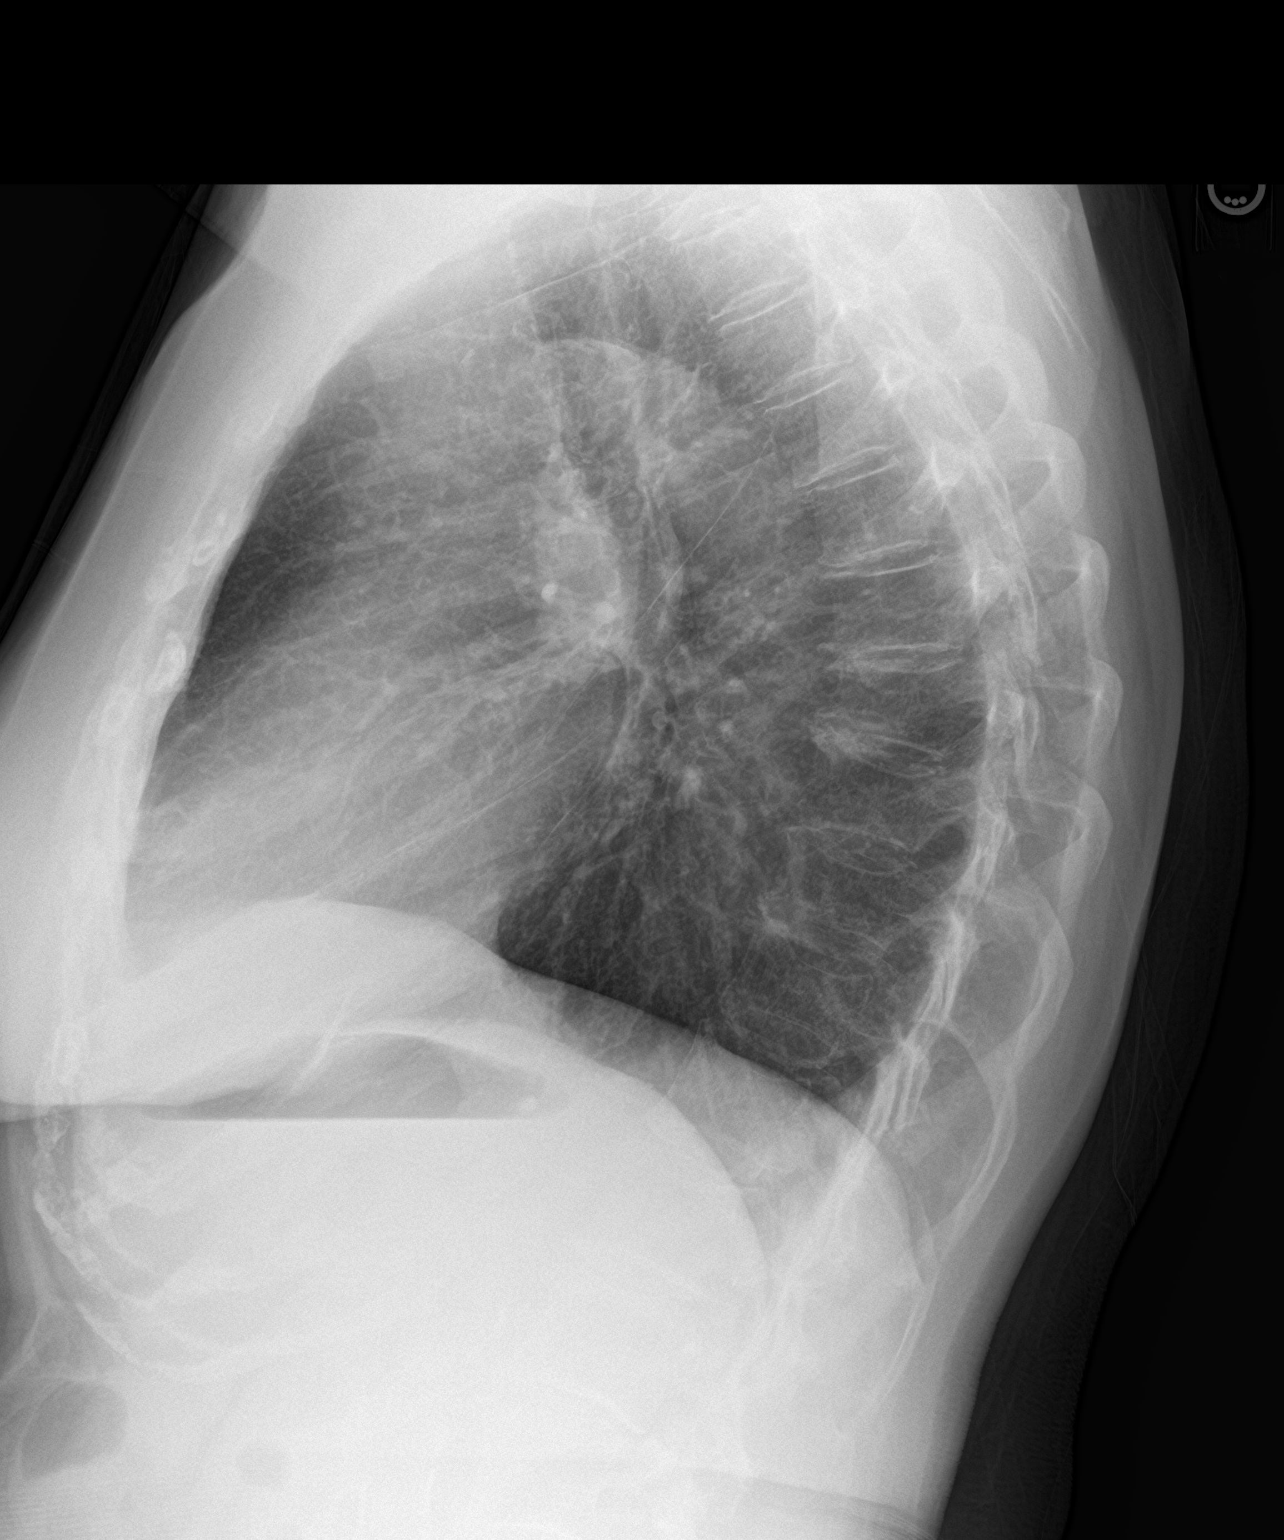

[2 of 2 positions shown; findings below may reference images not displayed]

FINDINGS: Lungs are adequately inflated without consolidation or effusion.
Cardiomediastinal silhouette is within normal. Minimal degenerative
change of the spine. Mild anterior wedging of a midthoracic
vertebral body likely chronic.
IMPRESSION: No acute cardiopulmonary disease.

Mild anterior wedging of a midthoracic vertebral body likely
chronic.

## 2020-01-23 ENCOUNTER — Other Ambulatory Visit: Payer: Self-pay | Admitting: Allergy and Immunology

## 2020-03-15 ENCOUNTER — Other Ambulatory Visit: Payer: Self-pay

## 2020-03-15 ENCOUNTER — Encounter: Payer: Self-pay | Admitting: Allergy and Immunology

## 2020-03-15 ENCOUNTER — Ambulatory Visit (INDEPENDENT_AMBULATORY_CARE_PROVIDER_SITE_OTHER): Payer: Medicare Other | Admitting: Allergy and Immunology

## 2020-03-15 VITALS — BP 154/86 | HR 74 | Resp 16

## 2020-03-15 DIAGNOSIS — J4550 Severe persistent asthma, uncomplicated: Secondary | ICD-10-CM

## 2020-03-15 DIAGNOSIS — K219 Gastro-esophageal reflux disease without esophagitis: Secondary | ICD-10-CM | POA: Diagnosis not present

## 2020-03-15 DIAGNOSIS — J3089 Other allergic rhinitis: Secondary | ICD-10-CM

## 2020-03-15 NOTE — Patient Instructions (Signed)
  1.  Continue to Treat and prevent inflammation:   A. SYMBICORT 160 - 2 inhalations 1-2 times per day with spacer  2.  Continue to Treat and prevent reflux:   A. Omeprazole 20 mg in AM  3.  If needed:   A. Albuterol HFA - 2 inhalations every 4-6 hours  4. Return to clinic in 6 months or earlier if problem

## 2020-03-15 NOTE — Progress Notes (Signed)
[place] [place] [facility] [place]   Follow-up Note  Referring Provider: Street, Sharon Mt, * Primary Provider: Street, Sharon Mt, MD Date of Office Visit: 03/15/2020  Subjective:   Wanda Patrick (DOB: 14-Feb-1936) is a 85 y.o. female who returns to the [facility] on 03/15/2020 in re-evaluation of the following:  HPI: Castella returns to this clinic in evaluation of severe asthma and allergic rhinitis and reflux.  Her last visit to this clinic was 14 September 2019.  Once again she has had an excellent interval of time regarding her respiratory tract without any significant problems involving her nose or chest and with no need to use a short acting bronchodilator and no requirement for systemic steroid or antibiotic to address a airway issue.  Currently she is using her Symbicort 1 time per day.  As well, she has had no problems with reflux and no problems with her throat on her current plan.  She has received 2 [facility] vaccinations but will not be receiving a booster because of the significant pruritic disorder that developed after her second injection.  Fortunately, that pruritic disorder has weaned significantly and its almost gone.  She did receive the flu vaccine.  Allergies as of 03/15/2020      Reactions   Erythromycin    Macrodantin [nitrofurantoin]    Sulfa Antibiotics       Medication List      albuterol 108 (90 Base) MCG/ACT inhaler Commonly known as: VENTOLIN HFA Can inhale two puffs every four to six hours as needed for cough or wheeze.   gabapentin 300 MG capsule Commonly known as: NEURONTIN Take 300 mg by mouth daily.   losartan 50 MG tablet Commonly known as: COZAAR Take 50 mg by mouth daily.   meclizine 25 MG tablet Commonly known as: ANTIVERT Take 25 mg by mouth 3 (three) times daily as needed for dizziness.   omeprazole 20 MG capsule Commonly known as: PRILOSEC Take 20 mg by mouth every morning.    simvastatin 40 MG tablet Commonly known as: ZOCOR TAKE 1 2 (ONE HALF) TABLET BY MOUTH EVERY DAY AT BEDTIME   Symbicort 160-4.5 MCG/ACT inhaler Generic drug: budesonide-formoterol INHALE 2 PUFFS INTO THE LUNGS TWICE DAILY WITH SPACER. RINSE, GARGLE, AND SPIT AFTER USE.   triamcinolone 0.1 % Commonly known as: KENALOG APPLY CREAM EXTERNALLY TWICE DAILY AS NEEDED TO AFFECTED SKIN       Past Medical History:  Diagnosis Date  . Asthma   . GERD (gastroesophageal reflux disease)   . Hypercholesteremia     History reviewed. No pertinent surgical history.  Review of systems negative except as noted in HPI / PMHx or noted below:  Review of Systems  Constitutional: Negative.   HENT: Negative.   Eyes: Negative.   Respiratory: Negative.   Cardiovascular: Negative.   Gastrointestinal: Negative.   Genitourinary: Negative.   Musculoskeletal: Negative.   Skin: Negative.   Neurological: Negative.   Endo/Heme/Allergies: Negative.   Psychiatric/Behavioral: Negative.      Objective:   Vitals:   03/15/20 1117 03/15/20 1139  BP: (!) 150/88 (!) 154/86  Pulse: 74   Resp: 16   SpO2: 99%           Physical Exam Constitutional:      Appearance: She is not diaphoretic.  HENT:     Head: Normocephalic.     Right Ear: Tympanic membrane, ear canal and external ear normal.     Left  Ear: Tympanic membrane, ear canal and external ear normal.     Nose: Nose normal. No mucosal edema or rhinorrhea.     Mouth/Throat:     Mouth: Oropharynx is clear and moist and mucous membranes are normal.     Pharynx: Uvula midline. No oropharyngeal exudate.  Eyes:     Conjunctiva/sclera: Conjunctivae normal.  Neck:     Thyroid: No thyromegaly.     Trachea: Trachea normal. No tracheal tenderness or tracheal deviation.  Cardiovascular:     Rate and Rhythm: Normal rate and regular rhythm.     Heart sounds: Normal heart sounds, S1 normal and S2 normal. No murmur heard.   Pulmonary:     Effort: No  respiratory distress.     Breath sounds: Normal breath sounds. No stridor. No wheezing or rales.  Musculoskeletal:        General: No edema.  Lymphadenopathy:     Head:     Right side of head: No tonsillar adenopathy.     Left side of head: No tonsillar adenopathy.     Cervical: No cervical adenopathy.  Skin:    Findings: No erythema or rash.     Nails: There is no clubbing.  Neurological:     Mental Status: She is alert.     Diagnostics:    Spirometry was performed and demonstrated an FEV1 of 2.05 at 115 % of predicted.   Assessment and Plan:   1. Asthma, severe persistent, well-controlled   2. Perennial allergic rhinitis   3. LPRD (laryngopharyngeal reflux disease)     1.  Continue to Treat and prevent inflammation:   A. SYMBICORT 160 - 2 inhalations 1-2 times per day with spacer  2.  Continue to Treat and prevent reflux:   A. Omeprazole 20 mg in AM  3.  If needed:   A. Albuterol HFA - 2 inhalations every 4-6 hours  4. Return to clinic in 6 months or earlier if problem  Morrisa appears to be doing quite well on her current plan and she will remain on Symbicort and remain on omeprazole to address respiratory tract inflammation and reflux induced respiratory disease and I will see her back in this clinic in 6 months or earlier if there is a problem.  Allena Katz, MD Allergy / Immunology [place]

## 2020-03-16 ENCOUNTER — Encounter: Payer: Self-pay | Admitting: Allergy and Immunology

## 2020-06-19 ENCOUNTER — Telehealth: Payer: Self-pay | Admitting: Allergy and Immunology

## 2020-06-19 ENCOUNTER — Other Ambulatory Visit: Payer: Self-pay

## 2020-06-19 MED ORDER — SYMBICORT 160-4.5 MCG/ACT IN AERO: INHALATION_SPRAY | RESPIRATORY_TRACT | 1 refills | Status: DC

## 2020-06-19 NOTE — Telephone Encounter (Signed)
Refill sent in and patient was informed

## 2020-06-19 NOTE — Telephone Encounter (Signed)
Patient called and needs to have Symbicort 160 called into [facility] [place]. She has appointment on July 21. [phone]

## 2020-07-09 DIAGNOSIS — R739 Hyperglycemia, unspecified: Secondary | ICD-10-CM | POA: Diagnosis not present

## 2020-07-09 DIAGNOSIS — E785 Hyperlipidemia, unspecified: Secondary | ICD-10-CM | POA: Diagnosis not present

## 2020-07-09 DIAGNOSIS — Z0000 Encounter for general adult medical examination without abnormal findings: Secondary | ICD-10-CM | POA: Diagnosis not present

## 2020-07-13 DIAGNOSIS — G500 Trigeminal neuralgia: Secondary | ICD-10-CM | POA: Diagnosis not present

## 2020-07-13 DIAGNOSIS — E785 Hyperlipidemia, unspecified: Secondary | ICD-10-CM | POA: Diagnosis not present

## 2020-07-13 DIAGNOSIS — Z0000 Encounter for general adult medical examination without abnormal findings: Secondary | ICD-10-CM | POA: Diagnosis not present

## 2020-07-13 DIAGNOSIS — K2960 Other gastritis without bleeding: Secondary | ICD-10-CM | POA: Diagnosis not present

## 2020-07-13 DIAGNOSIS — I10 Essential (primary) hypertension: Secondary | ICD-10-CM | POA: Diagnosis not present

## 2020-08-15 DIAGNOSIS — N3090 Cystitis, unspecified without hematuria: Secondary | ICD-10-CM | POA: Diagnosis not present

## 2020-08-15 DIAGNOSIS — N3001 Acute cystitis with hematuria: Secondary | ICD-10-CM | POA: Diagnosis not present

## 2020-08-31 DIAGNOSIS — Z20822 Contact with and (suspected) exposure to covid-19: Secondary | ICD-10-CM | POA: Diagnosis not present

## 2020-09-13 ENCOUNTER — Ambulatory Visit (INDEPENDENT_AMBULATORY_CARE_PROVIDER_SITE_OTHER): Payer: Medicare Other | Admitting: Allergy and Immunology

## 2020-09-13 ENCOUNTER — Other Ambulatory Visit: Payer: Self-pay

## 2020-09-13 ENCOUNTER — Encounter: Payer: Self-pay | Admitting: Allergy and Immunology

## 2020-09-13 VITALS — BP 148/68 | HR 63 | Resp 20 | Ht 66.2 in | Wt 167.6 lb

## 2020-09-13 DIAGNOSIS — J3089 Other allergic rhinitis: Secondary | ICD-10-CM

## 2020-09-13 DIAGNOSIS — J4550 Severe persistent asthma, uncomplicated: Secondary | ICD-10-CM | POA: Diagnosis not present

## 2020-09-13 DIAGNOSIS — K219 Gastro-esophageal reflux disease without esophagitis: Secondary | ICD-10-CM | POA: Diagnosis not present

## 2020-09-13 NOTE — Progress Notes (Signed)
[facility] [place]   Follow-up Note  Referring Provider: Street, Sharon Mt, * Primary Provider: Street, Sharon Mt, MD Date of Office Visit: 09/13/2020  Subjective:   Wanda Patrick (DOB: 1935/05/11) is a 85 y.o. female who returns to the [facility] on 09/13/2020 in re-evaluation of the following:  HPI: Wanda Patrick returns to this clinic in evaluation of asthma and allergic rhinitis and reflux.  Her last visit to this clinic was 15 March 2020.  Her asthma has been under excellent control.  Rarely does she use a short acting bronchodilator.  She is now walking two thirds of a mile up a big hill and down a big hill and it takes her about 25 to 30 minutes.  She uses her Symbicort mostly 1 time per day.  She has not required a systemic steroid to treat an exacerbation.  Her nose has been doing very well while intermittently using some Flonase.  She has not required an antibiotic to treat an episode of sinusitis.  Her reflux has been under excellent control and she has had no problems with her throat while using her proton pump inhibitor.  Allergies as of 09/13/2020       Reactions   Erythromycin    Macrodantin [nitrofurantoin]    Sulfa Antibiotics         Medication List    albuterol 108 (90 Base) MCG/ACT inhaler Commonly known as: VENTOLIN HFA Can inhale two puffs every four to six hours as needed for cough or wheeze.   fluticasone 50 MCG/ACT nasal spray Commonly known as: FLONASE SMARTSIG:1 Spray(s) Both Nares Twice Daily PRN   gabapentin 300 MG capsule Commonly known as: NEURONTIN Take 300 mg by mouth daily.   losartan 50 MG tablet Commonly known as: COZAAR Take 50 mg by mouth daily.   meclizine 25 MG tablet Commonly known as: ANTIVERT Take 25 mg by mouth 3 (three) times daily as needed for dizziness.   MULTIVITAMINS/MINERALS ADULT PO Take by mouth.   omeprazole 20 MG capsule Commonly known as:  PRILOSEC Take 20 mg by mouth every morning.   simvastatin 40 MG tablet Commonly known as: ZOCOR TAKE 1 2 (ONE HALF) TABLET BY MOUTH EVERY DAY AT BEDTIME   Symbicort 160-4.5 MCG/ACT inhaler Generic drug: budesonide-formoterol INHALE 2 PUFFS INTO THE LUNGS TWICE DAILY WITH SPACER. RINSE, GARGLE, AND SPIT AFTER USE.   triamcinolone cream 0.1 % Commonly known as: KENALOG APPLY CREAM EXTERNALLY TWICE DAILY AS NEEDED TO AFFECTED SKIN        Past Medical History:  Diagnosis Date   Asthma    GERD (gastroesophageal reflux disease)    Hypercholesteremia     History reviewed. No pertinent surgical history.  Review of systems negative except as noted in HPI / PMHx or noted below:  Review of Systems  Constitutional: Negative.   HENT: Negative.    Eyes: Negative.   Respiratory: Negative.    Cardiovascular: Negative.   Gastrointestinal: Negative.   Genitourinary: Negative.   Musculoskeletal: Negative.   Skin: Negative.   Neurological: Negative.   Endo/Heme/Allergies: Negative.   Psychiatric/Behavioral: Negative.      Objective:   Vitals:   09/13/20 1044  BP: (!) 148/68  Pulse: 63  Resp: 20  SpO2: 97%   Height: 5' 6.2" (168.1 cm)  Weight: 167 lb 9.6 oz (76 kg)   Physical Exam Constitutional:      Appearance: She is not diaphoretic.  HENT:  Head: Normocephalic.     Right Ear: Tympanic membrane, ear canal and external ear normal.     Left Ear: Tympanic membrane, ear canal and external ear normal.     Nose: Nose normal. No mucosal edema or rhinorrhea.     Mouth/Throat:     Pharynx: Uvula midline. No oropharyngeal exudate.  Eyes:     Conjunctiva/sclera: Conjunctivae normal.  Neck:     Thyroid: No thyromegaly.     Trachea: Trachea normal. No tracheal tenderness or tracheal deviation.  Cardiovascular:     Rate and Rhythm: Normal rate and regular rhythm.     Heart sounds: Normal heart sounds, S1 normal and S2 normal. No murmur heard. Pulmonary:     Effort: No  respiratory distress.     Breath sounds: Normal breath sounds. No stridor. No wheezing or rales.  Lymphadenopathy:     Head:     Right side of head: No tonsillar adenopathy.     Left side of head: No tonsillar adenopathy.     Cervical: No cervical adenopathy.  Skin:    Findings: No erythema or rash.     Nails: There is no clubbing.  Neurological:     Mental Status: She is alert.    Diagnostics:    Spirometry was performed and demonstrated an FEV1 of 1.92 at 97 % of predicted.  Assessment and Plan:   1. Asthma, severe persistent, well-controlled   2. Perennial allergic rhinitis   3. LPRD (laryngopharyngeal reflux disease)     1.  Continue to Treat and prevent inflammation:   A. Symbicort 160 - 2 inhalations 1-2 times per day with spacer  B. Flonase 1-2 sprays each nostril 0-7 times per week  2.  Continue to Treat and prevent reflux:   A. Omeprazole 20 mg in AM  3.  If needed:   A. Albuterol HFA - 2 inhalations every 4-6 hours  4. Return to clinic in 6 months or earlier if problem  5. PAXLOVID if infected with Covid  Wanda Patrick is really doing very well on her current plan and she will remain on anti-inflammatory agents for her upper airway and lower airway and therapy directed against reflux as noted above.  She developed a rather significant pruritic disorder during her second COVID vaccination and she will not be receiving a third vaccination.  I did inform her that she should consider taking Paxlovid if she is infected with COVID in the future.  Assuming she does well with this plan I will see her back in this clinic in 6 months or earlier if there is a problem  Allena Katz, MD Allergy / [place]

## 2020-09-13 NOTE — Patient Instructions (Signed)
  1.  Continue to Treat and prevent inflammation:   A. Symbicort 160 - 2 inhalations 1-2 times per day with spacer  B. Flonase 1-2 sprays each nostril 0-7 times per week  2.  Continue to Treat and prevent reflux:   A. Omeprazole 20 mg in AM  3.  If needed:   A. Albuterol HFA - 2 inhalations every 4-6 hours  4. Return to clinic in 6 months or earlier if problem  5. PAXLOVID if infected with Covid

## 2020-09-17 ENCOUNTER — Encounter: Payer: Self-pay | Admitting: Allergy and Immunology

## 2020-09-24 DIAGNOSIS — H353111 Nonexudative age-related macular degeneration, right eye, early dry stage: Secondary | ICD-10-CM | POA: Diagnosis not present

## 2020-10-05 DIAGNOSIS — N3091 Cystitis, unspecified with hematuria: Secondary | ICD-10-CM | POA: Diagnosis not present

## 2020-10-05 DIAGNOSIS — T300 Burn of unspecified body region, unspecified degree: Secondary | ICD-10-CM | POA: Diagnosis not present

## 2021-01-03 DIAGNOSIS — Z20828 Contact with and (suspected) exposure to other viral communicable diseases: Secondary | ICD-10-CM | POA: Diagnosis not present

## 2021-03-18 ENCOUNTER — Ambulatory Visit (INDEPENDENT_AMBULATORY_CARE_PROVIDER_SITE_OTHER): Payer: Medicare Other | Admitting: Allergy and Immunology

## 2021-03-18 ENCOUNTER — Encounter: Payer: Self-pay | Admitting: Allergy and Immunology

## 2021-03-18 ENCOUNTER — Other Ambulatory Visit: Payer: Self-pay

## 2021-03-18 VITALS — BP 140/76 | HR 56 | Resp 14

## 2021-03-18 DIAGNOSIS — J3089 Other allergic rhinitis: Secondary | ICD-10-CM

## 2021-03-18 DIAGNOSIS — J4550 Severe persistent asthma, uncomplicated: Secondary | ICD-10-CM | POA: Diagnosis not present

## 2021-03-18 DIAGNOSIS — K219 Gastro-esophageal reflux disease without esophagitis: Secondary | ICD-10-CM | POA: Diagnosis not present

## 2021-03-18 NOTE — Progress Notes (Signed)
[facility] [place] [facility] [place]   Follow-up Note  Referring Provider: Street, Sharon Mt, * Primary Provider: Street, Sharon Mt, MD Date of Office Visit: 03/18/2021  Subjective:   Wanda Patrick (DOB: 1935/11/22) is a 86 y.o. female who returns to the [facility] on 03/18/2021 in re-evaluation of the following:  HPI: Mattilynn returns to this clinic in evaluation of asthma and allergic rhinitis and reflux and an adverse response to COVID vaccination.  Her last visit to this clinic was 13 September 2020.  She has once again done very well regarding her asthma and she rarely uses a short acting bronchodilator while she continues on Symbicort 1 time per day and she can exercise without difficulty.  She has had very little issues with her nose and only intermittently uses Flonase.  She has not required a systemic steroid to treat an exacerbation of either her upper or lower airway disease.  She has not required an antibiotic to treat an airway issue.  Her reflux remains under excellent control with a proton pump inhibitor.  She did receive the flu vaccine this year.  She will not be progressing past her initial 2 vaccinations with COVID as she had a rather significant pruritic disorder that developed with her second COVID-vaccine.  Allergies as of 03/18/2021       Reactions   Erythromycin    Macrodantin [nitrofurantoin]    Sulfa Antibiotics         Medication List    EMERGEN-C VITAMIN D/CALCIUM PO Take by mouth.   PRESERVISION AREDS 2 PO Take by mouth.   fluticasone 50 MCG/ACT nasal spray Commonly known as: FLONASE SMARTSIG:1 Spray(s) Both Nares Twice Daily PRN   gabapentin 300 MG capsule Commonly known as: NEURONTIN Take 300 mg by mouth daily.   losartan 50 MG tablet Commonly known as: COZAAR Take 50 mg by mouth daily.   meclizine 25 MG tablet Commonly known as: ANTIVERT Take 25 mg by mouth 3 (three) times daily as needed for  dizziness.   omeprazole 20 MG capsule Commonly known as: PRILOSEC Take 20 mg by mouth every morning.   simvastatin 40 MG tablet Commonly known as: ZOCOR TAKE 1 2 (ONE HALF) TABLET BY MOUTH EVERY DAY AT BEDTIME   Symbicort 160-4.5 MCG/ACT inhaler Generic drug: budesonide-formoterol INHALE 2 PUFFS INTO THE LUNGS TWICE DAILY WITH SPACER. RINSE, GARGLE, AND SPIT AFTER USE.   triamcinolone cream 0.1 % Commonly known as: KENALOG APPLY CREAM EXTERNALLY TWICE DAILY AS NEEDED TO AFFECTED SKIN    Past Medical History:  Diagnosis Date   Asthma    GERD (gastroesophageal reflux disease)    Hypercholesteremia     History reviewed. No pertinent surgical history.  Review of systems negative except as noted in HPI / PMHx or noted below:  Review of Systems  Constitutional: Negative.   HENT: Negative.    Eyes: Negative.   Respiratory: Negative.    Cardiovascular: Negative.   Gastrointestinal: Negative.   Genitourinary: Negative.   Musculoskeletal: Negative.   Skin: Negative.   Neurological: Negative.   Endo/Heme/Allergies: Negative.   Psychiatric/Behavioral: Negative.      Objective:   Vitals:   03/18/21 1013 03/18/21 1032  BP: (!) 162/78 140/76  Pulse: (!) 56   Resp: 14   SpO2: 98%           Physical Exam Constitutional:      Appearance: She is not diaphoretic.  HENT:     Head:  Normocephalic.     Right Ear: Tympanic membrane, ear canal and external ear normal.     Left Ear: Tympanic membrane, ear canal and external ear normal.     Nose: Nose normal. No mucosal edema or rhinorrhea.     Mouth/Throat:     Pharynx: Uvula midline. No oropharyngeal exudate.  Eyes:     Conjunctiva/sclera: Conjunctivae normal.  Neck:     Thyroid: No thyromegaly.     Trachea: Trachea normal. No tracheal tenderness or tracheal deviation.  Cardiovascular:     Rate and Rhythm: Normal rate and regular rhythm.     Heart sounds: Normal heart sounds, S1 normal and S2 normal. No murmur  heard. Pulmonary:     Effort: No respiratory distress.     Breath sounds: Normal breath sounds. No stridor. No wheezing or rales.  Lymphadenopathy:     Head:     Right side of head: No tonsillar adenopathy.     Left side of head: No tonsillar adenopathy.     Cervical: No cervical adenopathy.  Skin:    Findings: No erythema or rash.     Nails: There is no clubbing.  Neurological:     Mental Status: She is alert.    Diagnostics:    Spirometry was performed and demonstrated an FEV1 of 1.95 at 99 % of predicted.  Assessment and Plan:   1. Asthma, severe persistent, well-controlled   2. Perennial allergic rhinitis   3. LPRD (laryngopharyngeal reflux disease)     1.  Continue to Treat and prevent inflammation:   A. Symbicort 160 - 2 inhalations 1-2 times per day with spacer  B. Flonase 1-2 sprays each nostril 0-7 times per week  2.  Continue to Treat and prevent reflux:   A. Omeprazole 20 mg in AM  3.  If needed:   A. Albuterol HFA - 2 inhalations every 4-6 hours  4. Return to clinic in 6 months or earlier if problem  5. PAXLOVID if infected with Covid. TAMIFLU if infected with influenza  Nickola appears to be doing very well and I will see her back in this clinic in 6 months or earlier if there is a problem while she continues to use anti-inflammatory agents for her airway and therapy directed against reflux.  She will contact me during interval should there be a problem.  Allena Katz, MD Allergy / Immunology [place]

## 2021-03-18 NOTE — Patient Instructions (Signed)
°  1.  Continue to Treat and prevent inflammation:   A. Symbicort 160 - 2 inhalations 1-2 times per day with spacer  B. Flonase 1-2 sprays each nostril 0-7 times per week  2.  Continue to Treat and prevent reflux:   A. Omeprazole 20 mg in AM  3.  If needed:   A. Albuterol HFA - 2 inhalations every 4-6 hours  4. Return to clinic in 6 months or earlier if problem  5. PAXLOVID if infected with Covid. TAMIFLU if infected with influenza

## 2021-03-19 ENCOUNTER — Encounter: Payer: Self-pay | Admitting: Allergy and Immunology

## 2021-04-24 DIAGNOSIS — Z20822 Contact with and (suspected) exposure to covid-19: Secondary | ICD-10-CM | POA: Diagnosis not present

## 2021-06-11 DIAGNOSIS — Z20822 Contact with and (suspected) exposure to covid-19: Secondary | ICD-10-CM | POA: Diagnosis not present

## 2021-07-15 DIAGNOSIS — E785 Hyperlipidemia, unspecified: Secondary | ICD-10-CM | POA: Diagnosis not present

## 2021-07-15 DIAGNOSIS — R7301 Impaired fasting glucose: Secondary | ICD-10-CM | POA: Diagnosis not present

## 2021-07-18 DIAGNOSIS — J301 Allergic rhinitis due to pollen: Secondary | ICD-10-CM | POA: Diagnosis not present

## 2021-07-18 DIAGNOSIS — E785 Hyperlipidemia, unspecified: Secondary | ICD-10-CM | POA: Diagnosis not present

## 2021-07-18 DIAGNOSIS — I10 Essential (primary) hypertension: Secondary | ICD-10-CM | POA: Diagnosis not present

## 2021-07-18 DIAGNOSIS — Z0000 Encounter for general adult medical examination without abnormal findings: Secondary | ICD-10-CM | POA: Diagnosis not present

## 2021-07-18 DIAGNOSIS — H8113 Benign paroxysmal vertigo, bilateral: Secondary | ICD-10-CM | POA: Diagnosis not present

## 2021-09-16 ENCOUNTER — Ambulatory Visit (INDEPENDENT_AMBULATORY_CARE_PROVIDER_SITE_OTHER): Payer: Medicare Other | Admitting: Allergy and Immunology

## 2021-09-16 ENCOUNTER — Encounter: Payer: Self-pay | Admitting: Allergy and Immunology

## 2021-09-16 VITALS — BP 136/78 | HR 65 | Resp 16 | Ht 66.0 in | Wt 166.8 lb

## 2021-09-16 DIAGNOSIS — J4550 Severe persistent asthma, uncomplicated: Secondary | ICD-10-CM

## 2021-09-16 DIAGNOSIS — K219 Gastro-esophageal reflux disease without esophagitis: Secondary | ICD-10-CM | POA: Diagnosis not present

## 2021-09-16 DIAGNOSIS — J3089 Other allergic rhinitis: Secondary | ICD-10-CM | POA: Diagnosis not present

## 2021-09-16 NOTE — Progress Notes (Unsigned)
[facility] [place]   Follow-up Note  Referring Provider: Street, Sharon Mt, * Primary Provider: Street, Sharon Mt, MD Date of Office Visit: 09/16/2021  Subjective:   Wanda Patrick (DOB: 18-Oct-1935) is a 86 y.o. female who returns to the [facility] on 09/16/2021 in re-evaluation of the following:  HPI: Wanda Patrick presents to this clinic in evaluation of asthma, allergic rhinitis, reflux.  I last saw her in this clinic on 18 March 2021.  She is really doing very well and has had very little problems with her lower airways and has not required a systemic steroid to treat an exacerbation while using Symbicort 1 time per day and very rare if any use of a short acting bronchodilator.  She can exercise without any problem.  She has had very little problems with her upper airways while intermittently using a nasal steroid and has not required an antibiotic treating episode of sinusitis.  Her reflux is under very good control while using a proton pump inhibitor.  Allergies as of 09/16/2021       Reactions   Erythromycin    Macrodantin [nitrofurantoin]    Sulfa Antibiotics         Medication List    EMERGEN-C VITAMIN D/CALCIUM PO Take by mouth.   PRESERVISION AREDS 2 PO Take by mouth.   fluticasone 50 MCG/ACT nasal spray Commonly known as: FLONASE SMARTSIG:1 Spray(s) Both Nares Twice Daily PRN   gabapentin 300 MG capsule Commonly known as: NEURONTIN Take 300 mg by mouth daily.   losartan 50 MG tablet Commonly known as: COZAAR Take 50 mg by mouth daily.   meclizine 25 MG tablet Commonly known as: ANTIVERT Take 25 mg by mouth 3 (three) times daily as needed for dizziness.   omeprazole 20 MG capsule Commonly known as: PRILOSEC Take 20 mg by mouth every morning.   simvastatin 40 MG tablet Commonly known as: ZOCOR TAKE 1 2 (ONE HALF) TABLET BY MOUTH EVERY DAY AT BEDTIME   Symbicort 160-4.5 MCG/ACT  inhaler Generic drug: budesonide-formoterol INHALE 2 PUFFS INTO THE LUNGS TWICE DAILY WITH SPACER. RINSE, GARGLE, AND SPIT AFTER USE.   triamcinolone cream 0.1 % Commonly known as: KENALOG APPLY CREAM EXTERNALLY TWICE DAILY AS NEEDED TO AFFECTED SKIN    Past Medical History:  Diagnosis Date   Asthma    GERD (gastroesophageal reflux disease)    Hypercholesteremia     History reviewed. No pertinent surgical history.  Review of systems negative except as noted in HPI / PMHx or noted below:  Review of Systems  Constitutional: Negative.   HENT: Negative.    Eyes: Negative.   Respiratory: Negative.    Cardiovascular: Negative.   Gastrointestinal: Negative.   Genitourinary: Negative.   Musculoskeletal: Negative.   Skin: Negative.   Neurological: Negative.   Endo/Heme/Allergies: Negative.   Psychiatric/Behavioral: Negative.       Objective:   Vitals:   09/16/21 1036  BP: 136/78  Pulse: 65  Resp: 16  SpO2: 98%   Height: '5\' 6"'$  (167.6 cm)  Weight: 166 lb 12.8 oz (75.7 kg)   Physical Exam Constitutional:      Appearance: She is not diaphoretic.  HENT:     Head: Normocephalic.     Right Ear: Tympanic membrane, ear canal and external ear normal.     Left Ear: Tympanic membrane, ear canal and external ear normal.     Nose: Nose normal. No mucosal edema or rhinorrhea.  Mouth/Throat:     Pharynx: Uvula midline. No oropharyngeal exudate.  Eyes:     Conjunctiva/sclera: Conjunctivae normal.  Neck:     Thyroid: No thyromegaly.     Trachea: Trachea normal. No tracheal tenderness or tracheal deviation.  Cardiovascular:     Rate and Rhythm: Normal rate and regular rhythm.     Heart sounds: Normal heart sounds, S1 normal and S2 normal. No murmur heard. Pulmonary:     Effort: No respiratory distress.     Breath sounds: Normal breath sounds. No stridor. No wheezing or rales.  Lymphadenopathy:     Head:     Right side of head: No tonsillar adenopathy.     Left side of  head: No tonsillar adenopathy.     Cervical: No cervical adenopathy.  Skin:    Findings: No erythema or rash.     Nails: There is no clubbing.  Neurological:     Mental Status: She is alert.     Diagnostics:    Spirometry was performed and demonstrated an FEV1 of 2.21 at 113 % of predicted.  Assessment and Plan:   1. Asthma, severe persistent, well-controlled   2. Perennial allergic rhinitis   3. LPRD (laryngopharyngeal reflux disease)     1.  Continue to Treat and prevent inflammation:   A. Symbicort 160 - 2 inhalations 1-2 times per day with spacer  B. Flonase 1-2 sprays each nostril 0-7 times per week  2.  Continue to Treat and prevent reflux:   A. Omeprazole 20 mg in AM  3.  If needed:   A. Albuterol HFA - 2 inhalations every 4-6 hours  4. Return to clinic in 6 months or earlier if problem  5. Obtain flu vaccine and RSV vaccine  Wanda Patrick appears to be doing very well and she has a very good understanding of her disease state and appropriate dosing of her medications depending on disease activity and she will continue on Symbicort and Flonase as anti-inflammatory therapy for her airway and continue on a proton pump inhibitor to treat address her reflux.  Assuming she does well with this plan I will see her back in this clinic in 6 months or earlier if there is a problem.  Allena Katz, MD Allergy / Immunology [place]

## 2021-09-16 NOTE — Patient Instructions (Signed)
  1.  Continue to Treat and prevent inflammation:   A. Symbicort 160 - 2 inhalations 1-2 times per day with spacer  B. Flonase 1-2 sprays each nostril 0-7 times per week  2.  Continue to Treat and prevent reflux:   A. Omeprazole 20 mg in AM  3.  If needed:   A. Albuterol HFA - 2 inhalations every 4-6 hours  4. Return to clinic in 6 months or earlier if problem  5. Obtain flu vaccine and RSV vaccine

## 2021-09-17 ENCOUNTER — Encounter: Payer: Self-pay | Admitting: Allergy and Immunology

## 2021-10-14 ENCOUNTER — Other Ambulatory Visit: Payer: Self-pay | Admitting: Allergy and Immunology

## 2021-11-14 ENCOUNTER — Telehealth: Payer: Self-pay | Admitting: Allergy and Immunology

## 2021-11-14 MED ORDER — NIRMATRELVIR/RITONAVIR (PAXLOVID)TABLET: 3.0000 | ORAL_TABLET | Freq: Two times a day (BID) | ORAL | 0 refills | Status: AC

## 2021-11-14 NOTE — Telephone Encounter (Signed)
Patient tested positive for COVID lastnight (9/20) and is feeling terrible. She has cold symptoms, lastnight had a fever of 100.2 and this morning 100.9. She is also very congested. Daughter is requesting we send in [place] to [facility] [place]

## 2021-11-14 NOTE — Telephone Encounter (Signed)
Called Almyra Free and informed her of the Paxlovid prescription.  Per Dr. Neldon Mc, will need to stop simvastatin while taking Paxlovid due to interactions.  Daughter informed.  Salcha sent to [facility] in [place].

## 2021-11-22 DIAGNOSIS — N390 Urinary tract infection, site not specified: Secondary | ICD-10-CM | POA: Diagnosis not present

## 2021-12-18 DIAGNOSIS — H1789 Other corneal scars and opacities: Secondary | ICD-10-CM | POA: Diagnosis not present

## 2021-12-18 DIAGNOSIS — H353131 Nonexudative age-related macular degeneration, bilateral, early dry stage: Secondary | ICD-10-CM | POA: Diagnosis not present

## 2022-02-07 DIAGNOSIS — H35371 Puckering of macula, right eye: Secondary | ICD-10-CM | POA: Diagnosis not present

## 2022-02-07 DIAGNOSIS — H4389 Other disorders of vitreous body: Secondary | ICD-10-CM | POA: Diagnosis not present

## 2022-02-07 DIAGNOSIS — H35372 Puckering of macula, left eye: Secondary | ICD-10-CM | POA: Diagnosis not present

## 2022-02-07 DIAGNOSIS — H353131 Nonexudative age-related macular degeneration, bilateral, early dry stage: Secondary | ICD-10-CM | POA: Diagnosis not present

## 2022-02-10 DIAGNOSIS — H4389 Other disorders of vitreous body: Secondary | ICD-10-CM | POA: Diagnosis not present

## 2022-02-10 DIAGNOSIS — Z01818 Encounter for other preprocedural examination: Secondary | ICD-10-CM | POA: Diagnosis not present

## 2022-03-19 ENCOUNTER — Encounter: Payer: Self-pay | Admitting: Allergy and Immunology

## 2022-03-19 ENCOUNTER — Ambulatory Visit (INDEPENDENT_AMBULATORY_CARE_PROVIDER_SITE_OTHER): Payer: Medicare Other | Admitting: Allergy and Immunology

## 2022-03-19 VITALS — BP 166/72 | HR 56 | Resp 18

## 2022-03-19 DIAGNOSIS — K219 Gastro-esophageal reflux disease without esophagitis: Secondary | ICD-10-CM

## 2022-03-19 DIAGNOSIS — J4550 Severe persistent asthma, uncomplicated: Secondary | ICD-10-CM | POA: Diagnosis not present

## 2022-03-19 DIAGNOSIS — J3089 Other allergic rhinitis: Secondary | ICD-10-CM | POA: Diagnosis not present

## 2022-03-19 MED ORDER — ALBUTEROL SULFATE HFA 108 (90 BASE) MCG/ACT IN AERS: INHALATION_SPRAY | RESPIRATORY_TRACT | 1 refills | Status: AC

## 2022-03-19 NOTE — Progress Notes (Unsigned)
[place] - [place] - [place]   Follow-up Note  Referring Provider: Street, Sharon Mt, * Primary Provider: Street, Sharon Mt, MD Date of Office Visit: 03/19/2022  Subjective:   Wanda Patrick (DOB: 06-17-1935) is a 87 y.o. female who returns to the [facility] on 03/19/2022 in re-evaluation of the following:  HPI: Shaylie presents to this clinic in evaluation of asthma, allergic rhinitis, reflux.  I last saw her in this clinic 16 September 2021.  She has not had any problems with asthma and has not required a short acting bronchodilator.  She has not required a systemic steroid to treat an exacerbation of asthma.  She has been using her Symbicort mostly 1 time per day.  She had very little problem with her nose and she rarely uses a nasal steroid.  Her reflux is under very good control using a proton pump inhibitor.  She contracted COVID September 2023 treated successfully with Paxlovid although it did sound as though she developed itching of her lower extremities when using Paxlovid and she only used 3 days of this medication.  She has obtained the flu vaccine.  She will be having left eye surgery next Tuesday apparently to remove the membrane off of her retina.  Allergies as of 03/19/2022       Reactions   Erythromycin    Macrodantin [nitrofurantoin]    Pyridium [phenazopyridine Hcl]    Sulfa Antibiotics         Medication List    albuterol 108 (90 Base) MCG/ACT inhaler Commonly known as: VENTOLIN HFA Can inhale two puffs every four to six hours as needed for cough or wheeze. Started by: Jiles Prows, MD   EMERGEN-C VITAMIN D/CALCIUM PO Take by mouth.   PRESERVISION AREDS 2 PO Take by mouth.   fluticasone 50 MCG/ACT nasal spray Commonly known as: FLONASE SMARTSIG:1 Spray(s) Both Nares Twice Daily PRN   gabapentin 300 MG capsule Commonly known as: NEURONTIN Take 300 mg by mouth daily.   losartan 50 MG  tablet Commonly known as: COZAAR Take 50 mg by mouth daily.   meclizine 25 MG tablet Commonly known as: ANTIVERT Take 25 mg by mouth 3 (three) times daily as needed for dizziness.   ofloxacin 0.3 % ophthalmic solution Commonly known as: OCUFLOX SMARTSIG:In Eye(s)   omeprazole 20 MG capsule Commonly known as: PRILOSEC Take 20 mg by mouth every morning.   prednisoLONE acetate 1 % ophthalmic suspension Commonly known as: PRED FORTE PLEASE SEE ATTACHED FOR DETAILED DIRECTIONS   REFRESH OP Apply to eye.   simvastatin 40 MG tablet Commonly known as: ZOCOR TAKE 1 2 (ONE HALF) TABLET BY MOUTH EVERY DAY AT BEDTIME   Symbicort 160-4.5 MCG/ACT inhaler Generic drug: budesonide-formoterol INHALE 2 PUFFS INTO THE LUNGS TWICE DAILY WITH SPACER. RINSE, GARGLE, AND SPIT AFTER USE.   triamcinolone cream 0.1 % Commonly known as: KENALOG APPLY CREAM EXTERNALLY TWICE DAILY AS NEEDED TO AFFECTED SKIN    Past Medical History:  Diagnosis Date   Asthma    GERD (gastroesophageal reflux disease)    Hypercholesteremia     History reviewed. No pertinent surgical history.  Review of systems negative except as noted in HPI / PMHx or noted below:  Review of Systems  Constitutional: Negative.   HENT: Negative.    Eyes: Negative.   Respiratory: Negative.    Cardiovascular: Negative.   Gastrointestinal: Negative.   Genitourinary: Negative.   Musculoskeletal: Negative.   Skin: Negative.  Neurological: Negative.   Endo/Heme/Allergies: Negative.   Psychiatric/Behavioral: Negative.       Objective:   Vitals:   03/19/22 1056  BP: (!) 166/72  Pulse: (!) 56  Resp: 18  SpO2: 97%          Physical Exam Constitutional:      Appearance: She is not diaphoretic.  HENT:     Head: Normocephalic.     Right Ear: Tympanic membrane, ear canal and external ear normal.     Left Ear: Tympanic membrane, ear canal and external ear normal.     Nose: Nose normal. No mucosal edema or rhinorrhea.      Mouth/Throat:     Pharynx: Uvula midline. No oropharyngeal exudate.  Eyes:     Conjunctiva/sclera: Conjunctivae normal.  Neck:     Thyroid: No thyromegaly.     Trachea: Trachea normal. No tracheal tenderness or tracheal deviation.  Cardiovascular:     Rate and Rhythm: Normal rate and regular rhythm.     Heart sounds: Normal heart sounds, S1 normal and S2 normal. No murmur heard. Pulmonary:     Effort: No respiratory distress.     Breath sounds: Normal breath sounds. No stridor. No wheezing or rales.  Lymphadenopathy:     Head:     Right side of head: No tonsillar adenopathy.     Left side of head: No tonsillar adenopathy.     Cervical: No cervical adenopathy.  Skin:    Findings: No erythema or rash.     Nails: There is no clubbing.  Neurological:     Mental Status: She is alert.     Diagnostics:    Spirometry was performed and demonstrated an FEV1 of 2.10 at 108.25 % of predicted.  Assessment and Plan:   1. Asthma, severe persistent, well-controlled   2. Perennial allergic rhinitis   3. LPRD (laryngopharyngeal reflux disease)    1.  Continue to Treat and prevent inflammation:   A. Symbicort 160 - 2 inhalations 1-2 times per day with spacer  2.  Continue to Treat and prevent reflux:   A. Omeprazole 20 mg in AM  3.  If needed:   A. Albuterol HFA - 2 inhalations every 4-6 hours  B. Flonase 1-2 sprays each nostril 0-7 times per week  4. Return to clinic in 6 months or earlier if problem  Marrissa appears to be doing quite well and she will remain on Symbicort on a consistent basis and omeprazole for her reflux and we will see her back in this clinic in 6 months or earlier if there is a problem.  Allena Katz, MD Allergy / Immunology [place]

## 2022-03-19 NOTE — Patient Instructions (Addendum)
  1.  Continue to Treat and prevent inflammation:   A. Symbicort 160 - 2 inhalations 1-2 times per day with spacer  2.  Continue to Treat and prevent reflux:   A. Omeprazole 20 mg in AM  3.  If needed:   A. Albuterol HFA - 2 inhalations every 4-6 hours  B. Flonase 1-2 sprays each nostril 0-7 times per week  4. Return to clinic in 6 months or earlier if problem

## 2022-03-20 ENCOUNTER — Encounter: Payer: Self-pay | Admitting: Allergy and Immunology

## 2022-03-27 HISTORY — PX: EYE SURGERY: SHX253

## 2022-04-15 DIAGNOSIS — H35372 Puckering of macula, left eye: Secondary | ICD-10-CM | POA: Diagnosis not present

## 2022-04-23 DIAGNOSIS — H35372 Puckering of macula, left eye: Secondary | ICD-10-CM | POA: Diagnosis not present

## 2022-04-29 DIAGNOSIS — R300 Dysuria: Secondary | ICD-10-CM | POA: Diagnosis not present

## 2022-04-29 DIAGNOSIS — N3090 Cystitis, unspecified without hematuria: Secondary | ICD-10-CM | POA: Diagnosis not present

## 2022-04-30 ENCOUNTER — Other Ambulatory Visit: Payer: Self-pay | Admitting: *Deleted

## 2022-04-30 MED ORDER — DULERA 200-5 MCG/ACT IN AERO: INHALATION_SPRAY | RESPIRATORY_TRACT | 5 refills | Status: DC

## 2022-05-28 DIAGNOSIS — H35372 Puckering of macula, left eye: Secondary | ICD-10-CM | POA: Diagnosis not present

## 2022-05-30 DIAGNOSIS — H353131 Nonexudative age-related macular degeneration, bilateral, early dry stage: Secondary | ICD-10-CM | POA: Diagnosis not present

## 2022-07-22 DIAGNOSIS — R7302 Impaired glucose tolerance (oral): Secondary | ICD-10-CM | POA: Diagnosis not present

## 2022-07-22 DIAGNOSIS — Z79899 Other long term (current) drug therapy: Secondary | ICD-10-CM | POA: Diagnosis not present

## 2022-07-22 DIAGNOSIS — E785 Hyperlipidemia, unspecified: Secondary | ICD-10-CM | POA: Diagnosis not present

## 2022-07-22 DIAGNOSIS — K2960 Other gastritis without bleeding: Secondary | ICD-10-CM | POA: Diagnosis not present

## 2022-07-22 DIAGNOSIS — M8589 Other specified disorders of bone density and structure, multiple sites: Secondary | ICD-10-CM | POA: Diagnosis not present

## 2022-07-28 DIAGNOSIS — K2960 Other gastritis without bleeding: Secondary | ICD-10-CM | POA: Diagnosis not present

## 2022-07-28 DIAGNOSIS — G500 Trigeminal neuralgia: Secondary | ICD-10-CM | POA: Diagnosis not present

## 2022-07-28 DIAGNOSIS — E785 Hyperlipidemia, unspecified: Secondary | ICD-10-CM | POA: Diagnosis not present

## 2022-07-28 DIAGNOSIS — Z0000 Encounter for general adult medical examination without abnormal findings: Secondary | ICD-10-CM | POA: Diagnosis not present

## 2022-07-28 DIAGNOSIS — I10 Essential (primary) hypertension: Secondary | ICD-10-CM | POA: Diagnosis not present

## 2022-09-17 ENCOUNTER — Encounter: Payer: Self-pay | Admitting: Allergy and Immunology

## 2022-09-17 ENCOUNTER — Ambulatory Visit (INDEPENDENT_AMBULATORY_CARE_PROVIDER_SITE_OTHER): Payer: Medicare Other | Admitting: Allergy and Immunology

## 2022-09-17 VITALS — BP 156/74 | HR 57 | Resp 16 | Ht 65.7 in | Wt 166.4 lb

## 2022-09-17 DIAGNOSIS — J4550 Severe persistent asthma, uncomplicated: Secondary | ICD-10-CM | POA: Diagnosis not present

## 2022-09-17 DIAGNOSIS — J3089 Other allergic rhinitis: Secondary | ICD-10-CM

## 2022-09-17 DIAGNOSIS — K219 Gastro-esophageal reflux disease without esophagitis: Secondary | ICD-10-CM

## 2022-09-17 MED ORDER — ADVAIR HFA 115-21 MCG/ACT IN AERO: INHALATION_SPRAY | RESPIRATORY_TRACT | 5 refills | Status: DC

## 2022-09-17 NOTE — Progress Notes (Unsigned)
[place] - [place] - [place] - [place] - [place]   Follow-up Note  Referring Provider: Street, Stephanie Coup, * Primary Provider: Street, Stephanie Coup, MD Date of Office Visit: 09/17/2022  Subjective:   Wanda Patrick (DOB: 1935/04/10) is a 87 y.o. female [facility] in re-evaluation of the following:  HPI: Wanda Patrick returns to this clinic in evaluation of asthma, allergic rhinitis, reflux.  I last saw her in this clinic 19 March 2022.  She has really done well with her airway and has not had an exacerbation requiring a systemic steroid or an antibiotic and has rare use of a short acting bronchodilator while she continues to use anti-inflammatory agents for her airway.  Unfortunately, her insurance company has been causing her some problems with acquisition of her combination inhalers and they have removed the use of Symbicort and she was using [facility] for a while but it was very difficult to get this medication.  Her reflux is under very good control on her current plan.  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  Allergies as of 09/17/2022       Reactions   Erythromycin    Macrodantin [nitrofurantoin]    Pyridium [phenazopyridine Hcl]    Sulfa Antibiotics         Medication List    albuterol 108 (90 Base) MCG/ACT inhaler Commonly known as: VENTOLIN HFA Can inhale two puffs every four to six hours as needed for cough or wheeze.   Dulera 200-5 MCG/ACT Aero Generic drug: mometasone-formoterol Inhale two puff twice daily to prevent cough or wheeze. Rinse mouth after use.   EMERGEN-C VITAMIN D/CALCIUM PO Take by mouth.   PRESERVISION AREDS 2 PO Take by mouth.   fluticasone 50 MCG/ACT nasal spray Commonly known as: FLONASE SMARTSIG:1 Spray(s) Both Nares Twice Daily PRN    gabapentin 300 MG capsule Commonly known as: NEURONTIN Take 300 mg by mouth daily.   losartan 50 MG tablet Commonly known as: COZAAR Take 50 mg by mouth daily.   meclizine 25 MG tablet Commonly known as: ANTIVERT Take 25 mg by mouth 3 (three) times daily as needed for dizziness.   omeprazole 20 MG capsule Commonly known as: PRILOSEC Take 20 mg by mouth every morning.   REFRESH OP Apply to eye.   simvastatin 40 MG tablet Commonly known as: ZOCOR TAKE 1 2 (ONE HALF) TABLET BY MOUTH EVERY DAY AT BEDTIME    Past Medical History:  Diagnosis Date   Asthma    GERD (gastroesophageal reflux disease)    History of shingles    2024   Hypercholesteremia     Past Surgical History:  Procedure Laterality Date   EYE SURGERY  03/2022    Review of systems negative except as noted in HPI / PMHx or noted below:  Review of Systems  Constitutional: Negative.   HENT: Negative.    Eyes: Negative.   Respiratory: Negative.    Cardiovascular: Negative.   Gastrointestinal: Negative.   Genitourinary: Negative.   Musculoskeletal: Negative.   Skin: Negative.   Neurological: Negative.   Endo/Heme/Allergies: Negative.   Psychiatric/Behavioral: Negative.       Objective:   Vitals:   09/17/22 1129 09/17/22 1132  BP: (!) 170/72 (!) 156/74  Pulse: (!) 57   Resp: 16  SpO2: 97%    Height: 5' 5.7" (166.9 cm)  Weight: 166 lb 6.4 oz (75.5 kg)   Physical Exam Constitutional:      Appearance: She is not diaphoretic.  HENT:     Head: Normocephalic.     Right Ear: Tympanic membrane, ear canal and external ear normal.     Left Ear: Tympanic membrane, ear canal and external ear normal.     Nose: Nose normal. No mucosal edema or rhinorrhea.     Mouth/Throat:     Pharynx: Uvula midline. No oropharyngeal exudate.  Eyes:     Conjunctiva/sclera: Conjunctivae normal.  Neck:     Thyroid: No thyromegaly.     Trachea: Trachea normal. No tracheal tenderness or tracheal deviation.   Cardiovascular:     Rate and Rhythm: Normal rate and regular rhythm.     Heart sounds: Normal heart sounds, S1 normal and S2 normal. No murmur heard. Pulmonary:     Effort: No respiratory distress.     Breath sounds: Normal breath sounds. No stridor. No wheezing or rales.  Lymphadenopathy:     Head:     Right side of head: No tonsillar adenopathy.     Left side of head: No tonsillar adenopathy.     Cervical: No cervical adenopathy.  Skin:    Findings: No erythema or rash.     Nails: There is no clubbing.  Neurological:     Mental Status: She is alert.     Diagnostics: Spirometry was performed and demonstrated an FEV1 of 2.04 at 106 % of predicted.  Assessment and Plan:   1. Asthma, severe persistent, well-controlled   2. Perennial allergic rhinitis   3. LPRD (laryngopharyngeal reflux disease)    1.  Continue to Treat and prevent inflammation:   A. Advair 115 HFA - 2 inhalations 1-2 times per day with spacer  2.  Continue to Treat and prevent reflux:   A. Omeprazole 20 mg in AM  3.  If needed:   A. Albuterol HFA - 2 inhalations every 4-6 hours  B. Flonase 1-2 sprays each nostril 0-7 times per week  4. Return to clinic in 6 months or earlier if problem  5. Plan for fall flu vaccine  From a respiratory standpoint Wanda Patrick is doing very well.  We are going to keep her on a combination inhaler even if she only uses this medication 1 time per day as a preventative agent and because of her insurance we will need to have her use Advair.  She will remain on omeprazole which has really treated her reflux quite well.  Will see her back in this clinic in 6 months or earlier if there is a problem.  [name], MD Allergy / Immunology [facility]

## 2022-09-17 NOTE — Patient Instructions (Addendum)
  1.  Continue to Treat and prevent inflammation:   A. Advair 115 HFA - 2 inhalations 1-2 times per day with spacer  2.  Continue to Treat and prevent reflux:   A. Omeprazole 20 mg in AM  3.  If needed:   A. Albuterol HFA - 2 inhalations every 4-6 hours  B. Flonase 1-2 sprays each nostril 0-7 times per week  4. Return to clinic in 6 months or earlier if problem  5. Plan for fall flu vaccine

## 2022-09-18 ENCOUNTER — Encounter: Payer: Self-pay | Admitting: Allergy and Immunology

## 2022-09-29 DIAGNOSIS — N3091 Cystitis, unspecified with hematuria: Secondary | ICD-10-CM | POA: Diagnosis not present

## 2022-10-02 DIAGNOSIS — H30033 Focal chorioretinal inflammation, peripheral, bilateral: Secondary | ICD-10-CM | POA: Diagnosis not present

## 2022-10-02 DIAGNOSIS — H3581 Retinal edema: Secondary | ICD-10-CM | POA: Diagnosis not present

## 2022-10-02 DIAGNOSIS — Z961 Presence of intraocular lens: Secondary | ICD-10-CM | POA: Diagnosis not present

## 2022-10-06 ENCOUNTER — Telehealth: Payer: Self-pay

## 2022-10-06 NOTE — Patient Outreach (Signed)
  Care Coordination   10/06/2022 Name: KEIRSTYN MHOON MRN: [id number] DOB: 10-30-1935   Care Coordination Outreach Attempts:  An unsuccessful telephone outreach was attempted today to offer the patient information about available care coordination services.  Follow Up Plan:  Additional outreach attempts will be made to offer the patient care coordination information and services.   Encounter Outcome:  No Answer   Care Coordination Interventions:  No, not indicated    Rowe Pavy, RN, BSN, [facility] [employer] [phone]

## 2022-10-07 ENCOUNTER — Telehealth: Payer: Self-pay

## 2022-10-07 NOTE — Patient Outreach (Signed)
  Care Coordination   10/07/2022 Name: Wanda Patrick MRN: [id number] DOB: 1935/05/29   Care Coordination Outreach Attempts:  A second unsuccessful outreach was attempted today to offer the patient with information about available care coordination services.  Follow Up Plan:  Additional outreach attempts will be made to offer the patient care coordination information and services.   Encounter Outcome:  No Answer   Care Coordination Interventions:  No, not indicated    Rowe Pavy, RN, BSN, [facility] [employer] [phone]

## 2022-10-09 ENCOUNTER — Telehealth: Payer: Self-pay

## 2022-10-09 NOTE — Patient Outreach (Signed)
  Care Coordination   10/09/2022 Name: Wanda Patrick MRN: [id number] DOB: 04/17/1935   Care Coordination Outreach Attempts:  A third unsuccessful outreach was attempted today to offer the patient with information about available care coordination services.  Follow Up Plan:  No further outreach attempts will be made at this time. We have been unable to contact the patient to offer or enroll patient in care coordination services  Encounter Outcome:  No Answer   Care Coordination Interventions:  No, not indicated    Rowe Pavy, RN, BSN, CEN [facility] Coordinator [phone]

## 2022-12-01 DIAGNOSIS — H30033 Focal chorioretinal inflammation, peripheral, bilateral: Secondary | ICD-10-CM | POA: Diagnosis not present

## 2022-12-01 DIAGNOSIS — Z961 Presence of intraocular lens: Secondary | ICD-10-CM | POA: Diagnosis not present

## 2022-12-01 DIAGNOSIS — H3581 Retinal edema: Secondary | ICD-10-CM | POA: Diagnosis not present

## 2023-02-10 DIAGNOSIS — J45909 Unspecified asthma, uncomplicated: Secondary | ICD-10-CM | POA: Diagnosis not present

## 2023-02-10 DIAGNOSIS — K219 Gastro-esophageal reflux disease without esophagitis: Secondary | ICD-10-CM | POA: Diagnosis not present

## 2023-02-10 DIAGNOSIS — Z882 Allergy status to sulfonamides status: Secondary | ICD-10-CM | POA: Diagnosis not present

## 2023-02-10 DIAGNOSIS — K8010 Calculus of gallbladder with chronic cholecystitis without obstruction: Secondary | ICD-10-CM | POA: Diagnosis not present

## 2023-02-10 DIAGNOSIS — R109 Unspecified abdominal pain: Secondary | ICD-10-CM | POA: Diagnosis not present

## 2023-02-10 DIAGNOSIS — R001 Bradycardia, unspecified: Secondary | ICD-10-CM | POA: Diagnosis not present

## 2023-02-10 DIAGNOSIS — I10 Essential (primary) hypertension: Secondary | ICD-10-CM | POA: Diagnosis not present

## 2023-02-10 DIAGNOSIS — Z1152 Encounter for screening for COVID-19: Secondary | ICD-10-CM | POA: Diagnosis not present

## 2023-02-10 DIAGNOSIS — E785 Hyperlipidemia, unspecified: Secondary | ICD-10-CM | POA: Diagnosis not present

## 2023-02-10 DIAGNOSIS — E871 Hypo-osmolality and hyponatremia: Secondary | ICD-10-CM | POA: Diagnosis not present

## 2023-02-10 DIAGNOSIS — R1111 Vomiting without nausea: Secondary | ICD-10-CM | POA: Diagnosis not present

## 2023-02-10 DIAGNOSIS — Z881 Allergy status to other antibiotic agents status: Secondary | ICD-10-CM | POA: Diagnosis not present

## 2023-02-10 DIAGNOSIS — K810 Acute cholecystitis: Secondary | ICD-10-CM | POA: Diagnosis not present

## 2023-02-10 DIAGNOSIS — R110 Nausea: Secondary | ICD-10-CM | POA: Diagnosis not present

## 2023-02-10 DIAGNOSIS — K851 Biliary acute pancreatitis without necrosis or infection: Secondary | ICD-10-CM | POA: Diagnosis not present

## 2023-02-10 DIAGNOSIS — K429 Umbilical hernia without obstruction or gangrene: Secondary | ICD-10-CM | POA: Diagnosis not present

## 2023-02-10 DIAGNOSIS — R197 Diarrhea, unspecified: Secondary | ICD-10-CM | POA: Diagnosis not present

## 2023-02-10 DIAGNOSIS — Z66 Do not resuscitate: Secondary | ICD-10-CM | POA: Diagnosis not present

## 2023-02-10 DIAGNOSIS — R1084 Generalized abdominal pain: Secondary | ICD-10-CM | POA: Diagnosis not present

## 2023-02-10 DIAGNOSIS — Z888 Allergy status to other drugs, medicaments and biological substances status: Secondary | ICD-10-CM | POA: Diagnosis not present

## 2023-02-10 DIAGNOSIS — K828 Other specified diseases of gallbladder: Secondary | ICD-10-CM | POA: Diagnosis not present

## 2023-02-10 DIAGNOSIS — E7800 Pure hypercholesterolemia, unspecified: Secondary | ICD-10-CM | POA: Diagnosis not present

## 2023-02-10 DIAGNOSIS — R1013 Epigastric pain: Secondary | ICD-10-CM | POA: Diagnosis not present

## 2023-02-10 DIAGNOSIS — K859 Acute pancreatitis without necrosis or infection, unspecified: Secondary | ICD-10-CM | POA: Diagnosis not present

## 2023-02-10 DIAGNOSIS — Z79899 Other long term (current) drug therapy: Secondary | ICD-10-CM | POA: Diagnosis not present

## 2023-02-11 DIAGNOSIS — K851 Biliary acute pancreatitis without necrosis or infection: Secondary | ICD-10-CM | POA: Diagnosis not present

## 2023-02-27 DIAGNOSIS — K828 Other specified diseases of gallbladder: Secondary | ICD-10-CM | POA: Diagnosis not present

## 2023-03-10 DIAGNOSIS — R7401 Elevation of levels of liver transaminase levels: Secondary | ICD-10-CM | POA: Diagnosis not present

## 2023-03-10 DIAGNOSIS — I10 Essential (primary) hypertension: Secondary | ICD-10-CM | POA: Diagnosis not present

## 2023-03-10 DIAGNOSIS — Z9049 Acquired absence of other specified parts of digestive tract: Secondary | ICD-10-CM | POA: Diagnosis not present

## 2023-03-10 DIAGNOSIS — E871 Hypo-osmolality and hyponatremia: Secondary | ICD-10-CM | POA: Diagnosis not present

## 2023-03-10 DIAGNOSIS — K861 Other chronic pancreatitis: Secondary | ICD-10-CM | POA: Diagnosis not present

## 2023-03-19 ENCOUNTER — Encounter: Payer: Self-pay | Admitting: Allergy and Immunology

## 2023-03-19 ENCOUNTER — Ambulatory Visit: Payer: Medicare Other | Admitting: Allergy and Immunology

## 2023-03-19 VITALS — BP 126/84 | HR 60 | Resp 16

## 2023-03-19 DIAGNOSIS — J3089 Other allergic rhinitis: Secondary | ICD-10-CM

## 2023-03-19 DIAGNOSIS — J4550 Severe persistent asthma, uncomplicated: Secondary | ICD-10-CM | POA: Diagnosis not present

## 2023-03-19 DIAGNOSIS — K219 Gastro-esophageal reflux disease without esophagitis: Secondary | ICD-10-CM | POA: Diagnosis not present

## 2023-03-19 NOTE — Patient Instructions (Addendum)
  1.  Continue to Treat and prevent inflammation:   A. Advair 115 HFA - 2 inhalations 1-2 times per day with spacer  2.  Continue to Treat and prevent reflux:   A. Omeprazole 20 mg in AM  3.  If needed:   A. Albuterol HFA - 2 inhalations every 4-6 hours  B. Flonase 1-2 sprays each nostril 0-7 times per week  4. Return to clinic in 6 months or earlier if problem  5. Influenza = Tamiflu. Covid = Paxlovid

## 2023-03-19 NOTE — Progress Notes (Signed)
[place] - [place] - [place] - [place]   Follow-up Note  Referring Provider: Street, Stephanie Coup, * Primary Provider: Street, Stephanie Coup, MD Date of Office Visit: 03/19/2023  Subjective:   Wanda Patrick (DOB: 10/23/1935) is a 88 y.o. female who [facility] in re-evaluation of the following:  HPI: Wanda Patrick returns to this clinic in evaluation of asthma, allergic rhinitis, reflux.  I last saw her in this clinic 17 September 2022.  She has really done well with her asthma and she has not required a systemic steroid to treat an exacerbation and she rarely uses a short acting bronchodilator and she has been able to taper down her controller agent and is using Advair just a few times per week.  She has had very little problems with her upper airway and has not required antibiotic to treat an episode of sinusitis while rarely using any nasal steroid.  Her reflux is under very good control using omeprazole on a daily basis.  She required a hospitalization in December 2024 for gallstone induced pancreatitis and she had a cholecystectomy and repair of an abdominal hernia and she has done very well as a result of that surgery.  She did not obtain a flu vaccine this year.  Allergies as of 03/19/2023       Reactions   Erythromycin    Macrodantin [nitrofurantoin]    Pyridium [phenazopyridine Hcl]    Sulfa Antibiotics         Medication List    Advair HFA 115-21 MCG/ACT inhaler Generic drug: fluticasone-salmeterol Inhale two puffs with spacer one to two times daily to prevent cough or wheeze.  Rinse, gargle, and spit after use.   albuterol 108 (90 Base) MCG/ACT inhaler Commonly known as: VENTOLIN HFA Can inhale two puffs every four to six hours as needed for cough or wheeze.   EMERGEN-C VITAMIN D/CALCIUM PO Take by mouth.   PRESERVISION AREDS 2 PO Take by mouth.   fluticasone 50 MCG/ACT nasal spray Commonly known as:  FLONASE SMARTSIG:1 Spray(s) Both Nares Twice Daily PRN   gabapentin 300 MG capsule Commonly known as: NEURONTIN Take 300 mg by mouth daily.   losartan 50 MG tablet Commonly known as: COZAAR Take 50 mg by mouth daily.   meclizine 25 MG tablet Commonly known as: ANTIVERT Take 25 mg by mouth 3 (three) times daily as needed for dizziness.   omeprazole 20 MG capsule Commonly known as: PRILOSEC Take 20 mg by mouth every morning.   REFRESH OP Apply to eye.   simvastatin 40 MG tablet Commonly known as: ZOCOR TAKE 1 2 (ONE HALF) TABLET BY MOUTH EVERY DAY AT BEDTIME    Past Medical History:  Diagnosis Date   Asthma    GERD (gastroesophageal reflux disease)    History of shingles    2024   Hypercholesteremia     Past Surgical History:  Procedure Laterality Date   EYE SURGERY  03/2022    Review of systems negative except as noted in HPI / PMHx or noted below:  Review of Systems  Constitutional: Negative.   HENT: Negative.    Eyes: Negative.   Respiratory: Negative.    Cardiovascular: Negative.   Gastrointestinal: Negative.   Genitourinary: Negative.   Musculoskeletal: Negative.   Skin: Negative.   Neurological: Negative.   Endo/Heme/Allergies: Negative.   Psychiatric/Behavioral: Negative.       Objective:   Vitals:   03/19/23 1054  BP: 126/84  Pulse: 60  Resp: 16  SpO2: 98%          Physical Exam Constitutional:      Appearance: She is not diaphoretic.  HENT:     Head: Normocephalic.     Right Ear: Tympanic membrane, ear canal and external ear normal.     Left Ear: Tympanic membrane, ear canal and external ear normal.     Nose: Nose normal. No mucosal edema or rhinorrhea.     Mouth/Throat:     Pharynx: Uvula midline. No oropharyngeal exudate.  Eyes:     Conjunctiva/sclera: Conjunctivae normal.  Neck:     Thyroid: No thyromegaly.     Trachea: Trachea normal. No tracheal tenderness or tracheal deviation.  Cardiovascular:     Rate and Rhythm:  Normal rate and regular rhythm.     Heart sounds: Normal heart sounds, S1 normal and S2 normal. No murmur heard. Pulmonary:     Effort: No respiratory distress.     Breath sounds: Normal breath sounds. No stridor. No wheezing or rales.  Lymphadenopathy:     Head:     Right side of head: No tonsillar adenopathy.     Left side of head: No tonsillar adenopathy.     Cervical: No cervical adenopathy.  Skin:    Findings: No erythema or rash.     Nails: There is no clubbing.  Neurological:     Mental Status: She is alert.     Diagnostics: Spirometry was performed and demonstrated an FEV1 of 2.08 at 108 % of predicted.  Assessment and Plan:   1. Asthma, severe persistent, well-controlled   2. Perennial allergic rhinitis   3. LPRD (laryngopharyngeal reflux disease)    1.  Continue to Treat and prevent inflammation:   A. Advair 115 HFA - 2 inhalations 1-2 times per day with spacer  2.  Continue to Treat and prevent reflux:   A. Omeprazole 20 mg in AM  3.  If needed:   A. Albuterol HFA - 2 inhalations every 4-6 hours  B. Flonase 1-2 sprays each nostril 0-7 times per week  4. Return to clinic in 6 months or earlier if problem  5. Influenza = Tamiflu. Covid = Paxlovid  Wanda Patrick appears to be doing very well and she will remain on a dose of Advair that is required to keep her asthma under good control which at this point in time is just a few times per week.  And she will continue to address her issue with reflux as noted above.  Assuming she does well with this plan I will see her back in this clinic in 6 months or earlier if there is a problem.  Wanda Schimke, MD Allergy / Immunology [facility]

## 2023-03-23 ENCOUNTER — Encounter: Payer: Self-pay | Admitting: Allergy and Immunology

## 2023-04-05 DIAGNOSIS — R350 Frequency of micturition: Secondary | ICD-10-CM | POA: Diagnosis not present

## 2023-04-05 DIAGNOSIS — R300 Dysuria: Secondary | ICD-10-CM | POA: Diagnosis not present

## 2023-07-12 DIAGNOSIS — N3090 Cystitis, unspecified without hematuria: Secondary | ICD-10-CM | POA: Diagnosis not present

## 2023-07-12 DIAGNOSIS — N3000 Acute cystitis without hematuria: Secondary | ICD-10-CM | POA: Diagnosis not present

## 2023-07-24 DIAGNOSIS — E785 Hyperlipidemia, unspecified: Secondary | ICD-10-CM | POA: Diagnosis not present

## 2023-07-24 DIAGNOSIS — Z131 Encounter for screening for diabetes mellitus: Secondary | ICD-10-CM | POA: Diagnosis not present

## 2023-08-07 DIAGNOSIS — K2960 Other gastritis without bleeding: Secondary | ICD-10-CM | POA: Diagnosis not present

## 2023-08-07 DIAGNOSIS — I10 Essential (primary) hypertension: Secondary | ICD-10-CM | POA: Diagnosis not present

## 2023-08-07 DIAGNOSIS — Z0000 Encounter for general adult medical examination without abnormal findings: Secondary | ICD-10-CM | POA: Diagnosis not present

## 2023-08-07 DIAGNOSIS — M8580 Other specified disorders of bone density and structure, unspecified site: Secondary | ICD-10-CM | POA: Diagnosis not present

## 2023-08-07 DIAGNOSIS — E785 Hyperlipidemia, unspecified: Secondary | ICD-10-CM | POA: Diagnosis not present

## 2023-09-16 ENCOUNTER — Encounter: Payer: Self-pay | Admitting: Allergy and Immunology

## 2023-09-16 ENCOUNTER — Ambulatory Visit (INDEPENDENT_AMBULATORY_CARE_PROVIDER_SITE_OTHER): Payer: Medicare Other | Admitting: Allergy and Immunology

## 2023-09-16 VITALS — BP 162/80 | HR 56 | Resp 16 | Ht 66.0 in | Wt 165.6 lb

## 2023-09-16 DIAGNOSIS — K219 Gastro-esophageal reflux disease without esophagitis: Secondary | ICD-10-CM | POA: Diagnosis not present

## 2023-09-16 DIAGNOSIS — J4550 Severe persistent asthma, uncomplicated: Secondary | ICD-10-CM

## 2023-09-16 DIAGNOSIS — J3089 Other allergic rhinitis: Secondary | ICD-10-CM

## 2023-09-16 DIAGNOSIS — R251 Tremor, unspecified: Secondary | ICD-10-CM | POA: Diagnosis not present

## 2023-09-16 MED ORDER — ADVAIR HFA 115-21 MCG/ACT IN AERO: INHALATION_SPRAY | RESPIRATORY_TRACT | 1 refills | Status: AC

## 2023-09-16 NOTE — Patient Instructions (Signed)
  1.  Continue to Treat and prevent inflammation:   A. Advair  115 HFA - 2 inhalations 3-7 times per week with spacer  B. Can increase Advair  to 2 inhalations 2 times per day  2.  Continue to Treat and prevent reflux:   A. Omeprazole  20 mg in AM  3.  If needed:   A. Albuterol  HFA - 2 inhalations every 4-6 hours  B. Flonase 1-2 sprays each nostril 0-7 times per week  4. Return to clinic in 6 months or earlier if problem  5. Influenza = Tamiflu. Covid = Paxlovid   6. Evaluation with neurologist?

## 2023-09-16 NOTE — Progress Notes (Unsigned)
 [place] - [place] - [place] - [place] - [place]   Follow-up Note  Referring Provider: Street, Lonni HERO, * Primary Provider: Street, Lonni HERO, MD Date of Office Visit: 09/16/2023  Subjective:   Wanda Patrick (DOB: Apr 23, 1935) is a 88 y.o. female who [facility] in re-evaluation of the following:  HPI: Wanda Patrick returns to this clinic in evaluation of asthma, allergic rhinitis, reflux.  I last saw in this clinic 19 March 2023.  Her asthma is been under excellent control and she is slowly tapered down her Advair  and she only uses Advair  a few times per week at this point in time and has no need to use the short acting bronchodilator and she can exert herself without any problem and has not required a systemic steroid to treat an exacerbation.  Likewise she has had very little problems with her upper airway as while intermittently using a nasal steroid.  And her reflux has been under excellent control while using her omeprazole  on a daily basis.  She informs me that she has been developing a tremor and unsteadiness.  The tremor is predominantly her hands and it actually sounds very significant to the point where she sometimes has difficulty loading of food on her plate to eat.  She apparently had a fall in March 2025 after bending over to pick up some trash and she may have developed a concussion and loss of consciousness with that event.  Allergies as of 09/16/2023       Reactions   Erythromycin    Macrodantin [nitrofurantoin]    Pyridium [phenazopyridine Hcl]    Sulfa Antibiotics         Medication List    Advair  HFA 115-21 MCG/ACT inhaler Generic drug: fluticasone-salmeterol Inhale two puffs with spacer one to two times daily to prevent cough or wheeze.  Rinse, gargle, and spit after use.   albuterol  108 (90 Base) MCG/ACT inhaler Commonly known as: VENTOLIN  HFA Can inhale two puffs every four to six hours as  needed for cough or wheeze.   fluticasone 50 MCG/ACT nasal spray Commonly known as: FLONASE SMARTSIG:1 Spray(s) Both Nares Twice Daily PRN   gabapentin 300 MG capsule Commonly known as: NEURONTIN Take 300 mg by mouth daily.   losartan 50 MG tablet Commonly known as: COZAAR Take 50 mg by mouth daily.   meclizine 25 MG tablet Commonly known as: ANTIVERT Take 25 mg by mouth 3 (three) times daily as needed for dizziness.   MULTIVITAMIN PO Take by mouth daily.   omeprazole  20 MG capsule Commonly known as: PRILOSEC Take 20 mg by mouth every morning.   PRESERVISION AREDS 2 PO Take by mouth.   REFRESH OP Apply to eye.   simvastatin 40 MG tablet Commonly known as: ZOCOR TAKE 1 2 (ONE HALF) TABLET BY MOUTH EVERY DAY AT BEDTIME   TYLENOL PO Take by mouth as needed.    Past Medical History:  Diagnosis Date   Asthma    GERD (gastroesophageal reflux disease)    History of shingles    2024   Hypercholesteremia     Past Surgical History:  Procedure Laterality Date   EYE SURGERY  03/2022    Review of systems negative except as noted in HPI / PMHx or noted below:  Review of Systems  Constitutional: Negative.   HENT: Negative.    Eyes: Negative.   Respiratory: Negative.    Cardiovascular: Negative.   Gastrointestinal: Negative.  Genitourinary: Negative.   Musculoskeletal: Negative.   Skin: Negative.   Neurological: Negative.   Endo/Heme/Allergies: Negative.   Psychiatric/Behavioral: Negative.       Objective:   Vitals:   09/16/23 1032  BP: (!) 152/70  Pulse: (!) 56  Resp: 16  SpO2: 96%   Height: 5' 6 (167.6 cm)  Weight: 165 lb 9.6 oz (75.1 kg)   Physical Exam Constitutional:      Appearance: She is not diaphoretic.  HENT:     Head: Normocephalic.     Right Ear: Tympanic membrane, ear canal and external ear normal.     Left Ear: Tympanic membrane, ear canal and external ear normal.     Nose: Nose normal. No mucosal edema or rhinorrhea.      Mouth/Throat:     Pharynx: Uvula midline. No oropharyngeal exudate.  Eyes:     Conjunctiva/sclera: Conjunctivae normal.  Neck:     Thyroid : No thyromegaly.     Trachea: Trachea normal. No tracheal tenderness or tracheal deviation.  Cardiovascular:     Rate and Rhythm: Normal rate and regular rhythm.     Heart sounds: Normal heart sounds, S1 normal and S2 normal. No murmur heard. Pulmonary:     Effort: No respiratory distress.     Breath sounds: Normal breath sounds. No stridor. No wheezing or rales.  Lymphadenopathy:     Head:     Right side of head: No tonsillar adenopathy.     Left side of head: No tonsillar adenopathy.     Cervical: No cervical adenopathy.  Skin:    Findings: No erythema or rash.     Nails: There is no clubbing.  Neurological:     Mental Status: She is alert.     Comments: Intention tremor and rest tremor upper extremities bilaterally     Diagnostics: Spirometry was performed and demonstrated an FEV1 of 1.96 at 103 % of predicted.  Assessment and Plan:   1. Asthma, severe persistent, well-controlled   2. Perennial allergic rhinitis   3. LPRD (laryngopharyngeal reflux disease)     1.  Continue to Treat and prevent inflammation:   A. Advair  115 HFA - 2 inhalations 3-7 times per week with spacer  B. Can increase Advair  to 2 inhalations 2 times per day  2.  Continue to Treat and prevent reflux:   A. Omeprazole  20 mg in AM  3.  If needed:   A. Albuterol  HFA - 2 inhalations every 4-6 hours  B. Flonase 1-2 sprays each nostril 0-7 times per week  4. Return to clinic in 6 months or earlier if problem  5. Influenza = Tamiflu. Covid = Paxlovid   6. Evaluation with neurologist?  Wanda Patrick is doing very well regarding her airway and her reflux on her current plan which includes mostly as needed medications directed against respiratory tract inflammation and the use of her proton pump inhibitor on a consistent basis.  She has a very good understanding of her  disease state and how her medications work and appropriate dosing of her medications depending on disease activity.  She is definitely developed a tremor issue and she is also a little unsteady and I had a talk with her today about possibly seeing a neurologist for further evaluation of this issue but at this point in time she is not very interested in seeing a neurologist but she will contact me should she require referral in the future.  Camellia Denis, MD Allergy  / Immunology [facility]

## 2023-09-17 ENCOUNTER — Encounter: Payer: Self-pay | Admitting: Allergy and Immunology

## 2023-11-02 DIAGNOSIS — Z961 Presence of intraocular lens: Secondary | ICD-10-CM | POA: Diagnosis not present

## 2023-11-02 DIAGNOSIS — H353122 Nonexudative age-related macular degeneration, left eye, intermediate dry stage: Secondary | ICD-10-CM | POA: Diagnosis not present

## 2023-11-12 DIAGNOSIS — R300 Dysuria: Secondary | ICD-10-CM | POA: Diagnosis not present

## 2023-11-12 DIAGNOSIS — N3091 Cystitis, unspecified with hematuria: Secondary | ICD-10-CM | POA: Diagnosis not present

## 2023-11-25 DIAGNOSIS — N390 Urinary tract infection, site not specified: Secondary | ICD-10-CM | POA: Diagnosis not present

## 2023-11-25 DIAGNOSIS — R350 Frequency of micturition: Secondary | ICD-10-CM | POA: Diagnosis not present

## 2023-11-25 DIAGNOSIS — R300 Dysuria: Secondary | ICD-10-CM | POA: Diagnosis not present

## 2023-11-25 DIAGNOSIS — N3001 Acute cystitis with hematuria: Secondary | ICD-10-CM | POA: Diagnosis not present

## 2024-01-16 DIAGNOSIS — M5430 Sciatica, unspecified side: Secondary | ICD-10-CM | POA: Diagnosis not present

## 2024-01-16 DIAGNOSIS — M549 Dorsalgia, unspecified: Secondary | ICD-10-CM | POA: Diagnosis not present

## 2024-01-19 DIAGNOSIS — R2681 Unsteadiness on feet: Secondary | ICD-10-CM | POA: Diagnosis not present

## 2024-01-19 DIAGNOSIS — E663 Overweight: Secondary | ICD-10-CM | POA: Diagnosis not present

## 2024-01-19 DIAGNOSIS — G5781 Other specified mononeuropathies of right lower limb: Secondary | ICD-10-CM | POA: Diagnosis not present

## 2024-01-19 DIAGNOSIS — M6281 Muscle weakness (generalized): Secondary | ICD-10-CM | POA: Diagnosis not present

## 2024-01-27 DIAGNOSIS — N390 Urinary tract infection, site not specified: Secondary | ICD-10-CM | POA: Diagnosis not present

## 2024-01-27 DIAGNOSIS — N3001 Acute cystitis with hematuria: Secondary | ICD-10-CM | POA: Diagnosis not present

## 2024-02-09 DIAGNOSIS — R319 Hematuria, unspecified: Secondary | ICD-10-CM | POA: Diagnosis not present

## 2024-02-09 DIAGNOSIS — N390 Urinary tract infection, site not specified: Secondary | ICD-10-CM | POA: Diagnosis not present

## 2024-03-16 ENCOUNTER — Ambulatory Visit: Admitting: Allergy and Immunology

## 2024-03-16 ENCOUNTER — Encounter: Payer: Self-pay | Admitting: Allergy and Immunology

## 2024-03-16 VITALS — BP 138/88 | HR 64 | Resp 18

## 2024-03-16 DIAGNOSIS — J3089 Other allergic rhinitis: Secondary | ICD-10-CM

## 2024-03-16 DIAGNOSIS — K219 Gastro-esophageal reflux disease without esophagitis: Secondary | ICD-10-CM

## 2024-03-16 DIAGNOSIS — J4550 Severe persistent asthma, uncomplicated: Secondary | ICD-10-CM | POA: Diagnosis not present

## 2024-03-16 NOTE — Patient Instructions (Signed)
" °  1.  Continue to Treat and prevent inflammation:   A. Advair  115 HFA - 2 inhalations 3-7 times per week with spacer  B. Can increase Advair  to 2 inhalations 2 times per day  2.  Continue to Treat and prevent reflux:   A. Omeprazole  20 mg in AM  3.  If needed:   A. Albuterol  HFA - 2 inhalations every 4-6 hours  B. Flonase 1-2 sprays each nostril 0-7 times per week  4. Return to clinic in 6 months or earlier if problem  5. Influenza = Tamiflu. Covid = Paxlovid   "

## 2024-03-16 NOTE — Progress Notes (Unsigned)
 "  [place] - [place] - [place] - [place] - [place]   Follow-up Note  Referring Provider: Street, Lonni HERO, MD Primary Provider: Street, Lonni HERO, MD Date of Office Visit: 03/16/2024  Subjective:   Wanda Patrick (DOB: 25-Dec-1935) is a 89 y.o. female who [facility] in re-evaluation of the following:  HPI: Wanda Patrick returns to this clinic in evaluation of asthma, allergic rhinitis, reflux.  I last saw her in this clinic 16 September 2023.  From an airway standpoint she is doing very well while using her Symbicort  2-3 times per week.  Rarely does she use a short acting bronchodilator and she can exert herself without any problem and she has not required a systemic steroid to treat an exacerbation of asthma.  Likewise, she has had very little problems with her upper airway and has not required an antibiotic to treat an episode of sinusitis while intermittently using nasal steroid.  And her reflux has been under very good control while using omeprazole  once a day.  Because of a vision issues she is no longer driving the car.  She stopped driving 01 December 2023.  She has also had problems with recurrent urinary tract infections and [facility].  She has had an issue with right inguinal pain that started up in October 2025 and apparently this was secondary to the obturator nerve and she is performing some exercises which is actually working quite well.  She was given Celebrex but it gave rise to a rash.  Allergies as of 03/16/2024       Reactions   Celebrex [celecoxib] Hives, Itching   Erythromycin    Macrodantin [nitrofurantoin]    Pyridium [phenazopyridine Hcl]    Sulfa Antibiotics         Medication List    Advair  HFA 115-21 MCG/ACT inhaler Generic drug: fluticasone-salmeterol Inhale two puffs with spacer three to seven times per week as directed to prevent cough or wheeze.  Rinse, gargle, and spit after use.    albuterol  108 (90 Base) MCG/ACT inhaler Commonly known as: VENTOLIN  HFA Can inhale two puffs every four to six hours as needed for cough or wheeze.   fluticasone 50 MCG/ACT nasal spray Commonly known as: FLONASE SMARTSIG:1 Spray(s) Both Nares Twice Daily PRN   gabapentin 300 MG capsule Commonly known as: NEURONTIN Take 300 mg by mouth daily.   losartan 50 MG tablet Commonly known as: COZAAR Take 50 mg by mouth daily.   meclizine 25 MG tablet Commonly known as: ANTIVERT Take 25 mg by mouth 3 (three) times daily as needed for dizziness.   MULTIVITAMIN PO Take by mouth daily.   omeprazole  20 MG capsule Commonly known as: PRILOSEC Take 20 mg by mouth every morning.   PRESERVISION AREDS 2 PO Take by mouth.   REFRESH OP Apply to eye.   simvastatin 40 MG tablet Commonly known as: ZOCOR TAKE 1 2 (ONE HALF) TABLET BY MOUTH EVERY DAY AT BEDTIME   TYLENOL PO Take by mouth as needed.    Past Medical History:  Diagnosis Date   Asthma    GERD (gastroesophageal reflux disease)    History of shingles    2024   Hypercholesteremia     Past Surgical History:  Procedure Laterality Date   EYE SURGERY  03/2022    Review of systems negative except as noted in HPI / PMHx or noted below:  Review of Systems  Constitutional: Negative.  HENT: Negative.    Eyes: Negative.   Respiratory: Negative.    Cardiovascular: Negative.   Gastrointestinal: Negative.   Genitourinary: Negative.   Musculoskeletal: Negative.   Skin: Negative.   Neurological: Negative.   Endo/Heme/Allergies: Negative.   Psychiatric/Behavioral: Negative.       Objective:   Vitals:   03/16/24 1053  BP: 138/88  Pulse: 64  Resp: 18  SpO2: 99%          Physical Exam Constitutional:      Appearance: She is not diaphoretic.  HENT:     Head: Normocephalic.     Right Ear: Tympanic membrane, ear canal and external ear normal.     Left Ear: Tympanic membrane, ear canal and external ear normal.      Nose: Nose normal. No mucosal edema or rhinorrhea.     Mouth/Throat:     Pharynx: Uvula midline. No oropharyngeal exudate.  Eyes:     Conjunctiva/sclera: Conjunctivae normal.  Neck:     Thyroid : No thyromegaly.     Trachea: Trachea normal. No tracheal tenderness or tracheal deviation.  Cardiovascular:     Rate and Rhythm: Normal rate and regular rhythm.     Heart sounds: Normal heart sounds, S1 normal and S2 normal. No murmur heard. Pulmonary:     Effort: No respiratory distress.     Breath sounds: Normal breath sounds. No stridor. No wheezing or rales.  Lymphadenopathy:     Head:     Right side of head: No tonsillar adenopathy.     Left side of head: No tonsillar adenopathy.     Cervical: No cervical adenopathy.  Skin:    Findings: No erythema or rash.     Nails: There is no clubbing.  Neurological:     Mental Status: She is alert.     Diagnostics: Spirometry was performed and demonstrated an FEV1 of 2.0 at 105 % of predicted.  Assessment and Plan:   1. Asthma, severe persistent, well-controlled (HCC)   2. Perennial allergic rhinitis   3. LPRD (laryngopharyngeal reflux disease)    1.  Continue to Treat and prevent inflammation:   A. Advair  115 HFA - 2 inhalations 3-7 times per week with spacer  B. Can increase Advair  to 2 inhalations 2 times per day  2.  Continue to Treat and prevent reflux:   A. Omeprazole  20 mg in AM  3.  If needed:   A. Albuterol  HFA - 2 inhalations every 4-6 hours  B. Flonase 1-2 sprays each nostril 0-7 times per week  4. Return to clinic in 6 months or earlier if problem  5. Influenza = Tamiflu. Covid = Paxlovid   Anie appears to be doing quite well on her current plan of using relatively low-dose Advair  and consistent use of omeprazole  to address her inflammatory and reflux induced inflammatory condition affecting her airway.  She will continue to utilize this plan and we will see her back in this clinic in 6 months or earlier if there is  a problem.  Camellia Denis, MD Allergy  / Immunology [facility] "

## 2024-03-17 ENCOUNTER — Encounter: Payer: Self-pay | Admitting: Allergy and Immunology

## 2024-09-15 ENCOUNTER — Ambulatory Visit: Admitting: Allergy and Immunology
